# Patient Record
Sex: Female | Born: 1975
Health system: Southern US, Community
[De-identification: ages and names within clinical notes are randomized; demographics above are authoritative.]

## PROBLEM LIST (undated history)

## (undated) DIAGNOSIS — I10 Essential (primary) hypertension: Secondary | ICD-10-CM

## (undated) DIAGNOSIS — M1990 Unspecified osteoarthritis, unspecified site: Secondary | ICD-10-CM

## (undated) DIAGNOSIS — K649 Unspecified hemorrhoids: Secondary | ICD-10-CM

## (undated) DIAGNOSIS — B019 Varicella without complication: Secondary | ICD-10-CM

## (undated) DIAGNOSIS — T783XXA Angioneurotic edema, initial encounter: Secondary | ICD-10-CM

## (undated) DIAGNOSIS — L509 Urticaria, unspecified: Secondary | ICD-10-CM

## (undated) DIAGNOSIS — E785 Hyperlipidemia, unspecified: Secondary | ICD-10-CM

## (undated) HISTORY — PX: LAPAROSCOPIC GASTRIC SLEEVE RESECTION: SHX5895

## (undated) HISTORY — DX: Hyperlipidemia, unspecified: E78.5

## (undated) HISTORY — DX: Essential (primary) hypertension: I10

## (undated) HISTORY — DX: Varicella without complication: B01.9

## (undated) HISTORY — DX: Angioneurotic edema, initial encounter: T78.3XXA

## (undated) HISTORY — DX: Urticaria, unspecified: L50.9

## (undated) HISTORY — DX: Unspecified hemorrhoids: K64.9

## (undated) HISTORY — DX: Unspecified osteoarthritis, unspecified site: M19.90

---

## 2000-05-26 HISTORY — PX: KNEE SURGERY: SHX244

## 2017-09-02 LAB — BASIC METABOLIC PANEL
CO2: 27 — AB (ref 13–22)
Chloride: 106 (ref 99–108)
Creatinine: 0.6 (ref 0.5–1.1)
Glucose: 16
Potassium: 3.9 (ref 3.4–5.3)
Sodium: 137 (ref 137–147)

## 2017-09-02 LAB — HEPATIC FUNCTION PANEL
ALT: 14 (ref 7–35)
AST: 13 (ref 13–35)
Alkaline Phosphatase: 36 (ref 25–125)
Bilirubin, Total: 0.3

## 2017-09-02 LAB — COMPREHENSIVE METABOLIC PANEL
Albumin: 4.1 (ref 3.5–5.0)
Calcium: 9.4 (ref 8.7–10.7)
GFR calc non Af Amer: 59

## 2017-09-02 LAB — CBC AND DIFFERENTIAL
HCT: 40 (ref 36–46)
Hemoglobin: 13.4 (ref 12.0–16.0)
Platelets: 312 (ref 150–399)
WBC: 5.9

## 2017-09-02 LAB — LIPID PANEL
Cholesterol: 210 — AB (ref 0–200)
HDL: 45 (ref 35–70)
LDL Cholesterol: 153
Triglycerides: 62 (ref 40–160)

## 2017-09-02 LAB — TSH: TSH: 1.76 (ref 0.41–5.90)

## 2017-09-02 LAB — VITAMIN D 25 HYDROXY (VIT D DEFICIENCY, FRACTURES): Vit D, 25-Hydroxy: 29

## 2018-06-16 DIAGNOSIS — Z79899 Other long term (current) drug therapy: Secondary | ICD-10-CM | POA: Diagnosis not present

## 2018-06-16 DIAGNOSIS — I10 Essential (primary) hypertension: Secondary | ICD-10-CM | POA: Diagnosis not present

## 2018-06-16 DIAGNOSIS — R635 Abnormal weight gain: Secondary | ICD-10-CM | POA: Diagnosis not present

## 2018-10-07 DIAGNOSIS — F419 Anxiety disorder, unspecified: Secondary | ICD-10-CM | POA: Diagnosis not present

## 2018-10-07 DIAGNOSIS — R635 Abnormal weight gain: Secondary | ICD-10-CM | POA: Diagnosis not present

## 2019-01-31 DIAGNOSIS — B029 Zoster without complications: Secondary | ICD-10-CM | POA: Diagnosis not present

## 2019-02-04 DIAGNOSIS — Z20828 Contact with and (suspected) exposure to other viral communicable diseases: Secondary | ICD-10-CM | POA: Diagnosis not present

## 2019-02-12 DIAGNOSIS — Z23 Encounter for immunization: Secondary | ICD-10-CM | POA: Diagnosis not present

## 2019-03-05 DIAGNOSIS — Z20828 Contact with and (suspected) exposure to other viral communicable diseases: Secondary | ICD-10-CM | POA: Diagnosis not present

## 2019-03-18 DIAGNOSIS — M542 Cervicalgia: Secondary | ICD-10-CM | POA: Diagnosis not present

## 2019-03-18 DIAGNOSIS — M9903 Segmental and somatic dysfunction of lumbar region: Secondary | ICD-10-CM | POA: Diagnosis not present

## 2019-03-18 DIAGNOSIS — M9901 Segmental and somatic dysfunction of cervical region: Secondary | ICD-10-CM | POA: Diagnosis not present

## 2019-03-18 DIAGNOSIS — M545 Low back pain: Secondary | ICD-10-CM | POA: Diagnosis not present

## 2019-03-22 DIAGNOSIS — M9901 Segmental and somatic dysfunction of cervical region: Secondary | ICD-10-CM | POA: Diagnosis not present

## 2019-03-22 DIAGNOSIS — M9903 Segmental and somatic dysfunction of lumbar region: Secondary | ICD-10-CM | POA: Diagnosis not present

## 2019-03-22 DIAGNOSIS — M542 Cervicalgia: Secondary | ICD-10-CM | POA: Diagnosis not present

## 2019-03-22 DIAGNOSIS — M545 Low back pain: Secondary | ICD-10-CM | POA: Diagnosis not present

## 2019-03-23 DIAGNOSIS — M545 Low back pain: Secondary | ICD-10-CM | POA: Diagnosis not present

## 2019-03-23 DIAGNOSIS — M9903 Segmental and somatic dysfunction of lumbar region: Secondary | ICD-10-CM | POA: Diagnosis not present

## 2019-03-23 DIAGNOSIS — M9901 Segmental and somatic dysfunction of cervical region: Secondary | ICD-10-CM | POA: Diagnosis not present

## 2019-03-23 DIAGNOSIS — M542 Cervicalgia: Secondary | ICD-10-CM | POA: Diagnosis not present

## 2019-03-25 DIAGNOSIS — M9903 Segmental and somatic dysfunction of lumbar region: Secondary | ICD-10-CM | POA: Diagnosis not present

## 2019-03-25 DIAGNOSIS — M9901 Segmental and somatic dysfunction of cervical region: Secondary | ICD-10-CM | POA: Diagnosis not present

## 2019-03-25 DIAGNOSIS — M545 Low back pain: Secondary | ICD-10-CM | POA: Diagnosis not present

## 2019-03-25 DIAGNOSIS — M542 Cervicalgia: Secondary | ICD-10-CM | POA: Diagnosis not present

## 2019-04-22 DIAGNOSIS — Z1231 Encounter for screening mammogram for malignant neoplasm of breast: Secondary | ICD-10-CM | POA: Diagnosis not present

## 2019-06-13 DIAGNOSIS — Z20828 Contact with and (suspected) exposure to other viral communicable diseases: Secondary | ICD-10-CM | POA: Diagnosis not present

## 2019-07-26 DIAGNOSIS — Z0000 Encounter for general adult medical examination without abnormal findings: Secondary | ICD-10-CM | POA: Diagnosis not present

## 2019-07-26 LAB — HM PAP SMEAR: HM Pap smear: NEGATIVE

## 2019-08-25 ENCOUNTER — Other Ambulatory Visit: Payer: Self-pay

## 2019-08-25 ENCOUNTER — Ambulatory Visit: Payer: Self-pay | Attending: Internal Medicine

## 2019-08-25 DIAGNOSIS — Z23 Encounter for immunization: Secondary | ICD-10-CM

## 2019-08-25 NOTE — Progress Notes (Signed)
   Covid-19 Vaccination Clinic  Name:  Janet Owens    MRN: [id number] DOB: [birth year]  08/25/2019  [name] was observed post Covid-19 immunization for 15 minutes without incident. She was provided with Vaccine Information Sheet and instruction to access the V-Safe system.   [name] was instructed to call 911 with any severe reactions post vaccine: Marland Kitchen Difficulty breathing  . Swelling of face and throat  . A fast heartbeat  . A bad rash all over body  . Dizziness and weakness   Immunizations Administered    Name Date Dose VIS Date Route   Pfizer COVID-19 Vaccine 08/25/2019 10:04 AM 0.3 mL 05/06/2019 Intramuscular   Manufacturer: [employer], [employer]   Lot: [phone] [id number]   NDC: [address]-6

## 2019-09-04 NOTE — Progress Notes (Signed)
[facility] [place], [place], [place] [address] [phone]  Date:  09/05/2019   Name:  Janet Owens   DOB:  07/07/1975   MRN:  [id number]  PCP:  Darreld Mclean, MD    Chief Complaint: New Patient (Initial Visit) (hypertension)   History of Present Illness:  Janet Owens is a 44 y.o. very pleasant female patient who presents with the following:  Here today as a new patient to establish care She has a 76 yo son and 18 yo daughter- they are both in good health  She and her family recently moved to [place] to be closer to family She is married to  [place]- "[place]" Her mother is currently living with her immediate family  She has one younger brother and her oldest sibling died She works for [employer]- she is able to work from home, has done so since 2016 Her father passed last fall following a battle with biliary tract carcinoma  She has HTN- since she was about 44 yo She gained some weight while her dad was sick and passing away- she put in about 35lbs and is working on this again.  A low-carb diet seems to work for her  Never a smoker She has OA- her mother has this as well  She uses naproxen on a daily basis; she was given mobic in the past from a specialist.  She was not sure if this worked any better than naproxen, so she stopped taking it She does sometimes have GERD sx  The naproxen does not always work but does not okay job, Tylenol is not effective She is taking an OTC antiacid -H2 blocker  She did have a CPE not long ago-  Her former PCP was Dr Alberteen Spindle Address: [address], [place], [place]  Phone: [phone] We requested records from Dr. Dorann Ou  She has a copper IUD  Her husband is interested in a vasectomy -after he has this surgery she would like to have her IUD removed Patient Active Problem List   Diagnosis Date Noted  . Essential hypertension 09/05/2019  .  Hyperlipidemia 09/05/2019    No past medical history on file.    Social History   Tobacco Use  . Smoking status: Never Smoker  . Smokeless tobacco: Never Used  Substance Use Topics  . Alcohol use: Yes  . Drug use: Never    No family history on file.  Allergies  Allergen Reactions  . Other Hives  . Penicillins Hives    Medication list has been reviewed and updated.  Current Outpatient Medications on File Prior to Visit  Medication Sig Dispense Refill  . Echinacea 450 MG CAPS Take 450 mg by mouth daily.    . famotidine (PEPCID) 20 MG tablet Take 20 mg by mouth daily.    Marland Kitchen lisinopril (ZESTRIL) 20 MG tablet Take 20 mg by mouth daily.    . naproxen sodium (ALEVE) 220 MG tablet Take 220 mg by mouth.    Marland Kitchen UNABLE TO FIND 2,000 Units. Med Name: Naltokinase    . UNABLE TO FIND Med Name: Calcium with D3 40 mcg 1600 units D3 20 mg    . vitamin k 100 MCG tablet Take 100 mcg by mouth daily.     No current facility-administered medications on file prior to visit.    Review of Systems:  As per HPI- otherwise negative.   Physical Examination: Vitals:  09/05/19 1143  BP: 132/84  Pulse: 60  Resp: 16  Temp: 98.1 F (36.7 C)  SpO2: 95%   Vitals:   09/05/19 1143  Weight: 254 lb (115.2 kg)  Height: 5\' 3"  (1.6 m)   Body mass index is 44.99 kg/m. Ideal Body Weight: Weight in (lb) to have BMI = 25: 140.8  GEN: no acute distress.  Obese, otherwise looks well HEENT: Atraumatic, Normocephalic.  Ears and Nose: No external deformity. CV: RRR, No M/G/R. No JVD. No thrill. No extra heart sounds. PULM: CTA B, no wheezes, crackles, rhonchi. No retractions. No resp. distress. No accessory muscle use. ABD: S, NT, ND, +BS. No rebound. No HSM. EXTR: No c/c/e PSYCH: Normally interactive. Conversant.    Assessment and Plan: Essential hypertension  Mixed hyperlipidemia  Class 3 severe obesity without serious comorbidity with body mass index (BMI) of 40.0 to 44.9 in adult,  unspecified obesity type (HCC)  Spondylosis of cervical region without myelopathy or radiculopathy - Plan: celecoxib (CELEBREX) 200 MG capsule  Here today as a new patient to establish care.  She recently moved to this area Blood pressures under good control on current dose of lisinopril.  Reminded patient that pregnancy is contraindicated while on ACE inhibitor For the time being she does not wish to take medication for lipids She notes chronic neck pain due to arthritis.  She is currently using OTC naproxen which works fairly well, but does cause some gastritis.  We will have her try Celebrex.  I offered to do an H. pylori breath test, but she really does not have time today She plans to work on her weight and lose some of the pounds she put on while her father was sick She plans to see me for a physical when due Moderate medical decision making today This visit occurred during the SARS-CoV-2 public health emergency.  Safety protocols were in place, including screening questions prior to the visit, additional usage of staff PPE, and extensive cleaning of exam room while observing appropriate contact time as indicated for disinfecting solutions.    Signed , MD

## 2019-09-04 NOTE — Patient Instructions (Addendum)
It was very nice to meet you today- I am so sorry for the loss of your dad We will request your records from Dr Lyndal Rainbow Try celebrex once a day as needed for arthritis pain- please let me know if this helps with your stomach irritation   We can plan to visit when your next physical is due otherwise

## 2019-09-05 ENCOUNTER — Other Ambulatory Visit: Payer: Self-pay

## 2019-09-05 ENCOUNTER — Ambulatory Visit: Payer: BLUE CROSS/BLUE SHIELD | Admitting: Family Medicine

## 2019-09-05 ENCOUNTER — Encounter: Payer: Self-pay | Admitting: Family Medicine

## 2019-09-05 VITALS — BP 132/84 | HR 60 | Temp 98.1°F | Resp 16 | Ht 63.0 in | Wt 254.0 lb

## 2019-09-05 DIAGNOSIS — E782 Mixed hyperlipidemia: Secondary | ICD-10-CM | POA: Diagnosis not present

## 2019-09-05 DIAGNOSIS — I10 Essential (primary) hypertension: Secondary | ICD-10-CM | POA: Insufficient documentation

## 2019-09-05 DIAGNOSIS — M47812 Spondylosis without myelopathy or radiculopathy, cervical region: Secondary | ICD-10-CM | POA: Diagnosis not present

## 2019-09-05 DIAGNOSIS — E66811 Obesity, class 1: Secondary | ICD-10-CM | POA: Insufficient documentation

## 2019-09-05 DIAGNOSIS — E785 Hyperlipidemia, unspecified: Secondary | ICD-10-CM | POA: Insufficient documentation

## 2019-09-05 DIAGNOSIS — Z6841 Body Mass Index (BMI) 40.0 and over, adult: Secondary | ICD-10-CM

## 2019-09-05 MED ORDER — CELECOXIB 200 MG PO CAPS: 200.0000 mg | ORAL_CAPSULE | Freq: Every day | ORAL | 1 refills | Status: DC

## 2019-09-12 ENCOUNTER — Telehealth: Payer: Self-pay

## 2019-09-12 ENCOUNTER — Encounter: Payer: Self-pay | Admitting: Family Medicine

## 2019-09-12 ENCOUNTER — Telehealth: Payer: Self-pay | Admitting: Family Medicine

## 2019-09-12 NOTE — Telephone Encounter (Signed)
Caller : Day Greb  Call Back # [phone]  Subject : Pre Auth For Medication   Medication : celecoxib (CELEBREX) 200 MG capsule  Patient states that insurance will not fill until prior Berkley Harvey is granted.   Insurance Telephone # [phone]

## 2019-09-12 NOTE — Telephone Encounter (Signed)
Initiated prior authorization

## 2019-09-14 ENCOUNTER — Encounter: Payer: Self-pay | Admitting: Family Medicine

## 2019-09-14 DIAGNOSIS — M47812 Spondylosis without myelopathy or radiculopathy, cervical region: Secondary | ICD-10-CM

## 2019-09-14 MED ORDER — CELECOXIB 200 MG PO CAPS: 200.0000 mg | ORAL_CAPSULE | Freq: Every day | ORAL | 1 refills | Status: DC

## 2019-09-14 NOTE — Telephone Encounter (Signed)
Dr. Copland, the prior auth I completed for this medication is not approved per her insurance. They are requiring her to try naproxen, etodolac or ibuprofen. Please advise and/or send in new alternative medication.  

## 2019-09-14 NOTE — Telephone Encounter (Signed)
Dr. Patsy Lager, the prior auth I completed for this medication is not approved per her insurance. They are requiring her to try naproxen, etodolac or ibuprofen. Please advise and/or send in new alternative medication.

## 2019-09-20 ENCOUNTER — Encounter: Payer: Self-pay | Admitting: Family Medicine

## 2019-09-20 ENCOUNTER — Ambulatory Visit: Payer: Self-pay

## 2019-12-02 DIAGNOSIS — Z20822 Contact with and (suspected) exposure to covid-19: Secondary | ICD-10-CM | POA: Diagnosis not present

## 2019-12-12 DIAGNOSIS — B351 Tinea unguium: Secondary | ICD-10-CM | POA: Diagnosis not present

## 2020-02-25 NOTE — Progress Notes (Addendum)
[facility] [address] [place], [place] [address] [phone]  Date:  02/27/2020   Name:  Janet Owens   DOB:  April 18, 1976   MRN:  [id number]  PCP:  Pearline Cables, MD    Chief Complaint: Arm Pain (left arm pain, radiates to neck, going on several years, arthritis related)   History of Present Illness:  Janet Owens is a 44 y.o. very pleasant female patient who presents with the following:  Patient with history of hypertension, hyperlipidemia, today, spondylosis of cervical region  Here today with concern of a few things She has been using celebrex for her pain which is better for her stomach than ibuprofen; she no longer has to take antacids  She repeat seeing orthopedics a few years ago, told she had OA in her cervical spine- she thinks this is what is causing her left arm and shoulder pain.  They did do some cervical spine films at that time The pain may shoot down the arm.  The right arm is ok  She has had trouble with her neck and shoulder for about 3 years  She also recently noted one episode of chest pain One night she noted a sharp feeling of pain in her chest that lasted for 20 minutes- this occurred perhaps 3 weeks ago.  It was to the left of her sternum Has not occurred again She does not get any CP or SOB with exercise She goes up and down stairs a lot and feels fine No personal history of CAD Mother has HTN and hyperlipidemia Pt has not smoked in about 15 years - former light smoker only   Last seen by myself in April of this year to establish care as a new patient Married to [place], has 2 young children and works for [employer] She is under a lot of stress right now- she is splitting from her husband, she got a promotion and started a new job Her children are 7 and 79 yo - they are in good health   COVID-19 series- done  Flu vaccine- pt plans to do later on this season as she  just started a new job  Can do HIV and hep C screening with next routine labs Blood drawn in April  She has an IUD in place-no risk of pregnancy  BP Readings from Last 3 Encounters:  02/27/20 136/82  09/05/19 132/84     Wt Readings from Last 3 Encounters:  02/27/20 238 lb (108 kg)  09/05/19 254 lb (115.2 kg)    Patient Active Problem List   Diagnosis Date Noted  . Essential hypertension 09/05/2019  . Hyperlipidemia 09/05/2019  . Class 3 severe obesity without serious comorbidity with body mass index (BMI) of 40.0 to 44.9 in adult [facility]) 09/05/2019  . Spondylosis of cervical region without myelopathy or radiculopathy 09/05/2019    Past Medical History:  Diagnosis Date  . Arthritis   . Chickenpox   . Hyperlipidemia   . Hypertension     No past surgical history on file.  Social History   Tobacco Use  . Smoking status: Never Smoker  . Smokeless tobacco: Never Used  Substance Use Topics  . Alcohol use: Yes  . Drug use: Never    Family History  Problem Relation Age of Onset  . Arthritis Mother   . Hyperlipidemia Mother   . Hypertension Mother   . Cancer Father   . COPD Father   .  Depression Father   . Drug abuse Father   . Early death Father   . Heart disease Father   . Hearing loss Father   . Stroke Father   . Alcohol abuse Father   . Alcohol abuse Maternal Grandmother   . Hearing loss Maternal Grandmother   . Miscarriages / Stillbirths Maternal Grandmother   . Heart attack Maternal Grandfather   . Hyperlipidemia Maternal Grandfather   . Hypertension Maternal Grandfather   . Alcohol abuse Paternal Grandmother   . Depression Paternal Grandmother   . Drug abuse Paternal Grandmother   . Heart attack Paternal Grandmother   . Hyperlipidemia Paternal Grandmother   . Hypertension Paternal Grandmother   . Heart disease Paternal Grandmother   . Birth defects Paternal Grandfather   . Alcohol abuse Brother   . Asthma Brother   . Drug abuse Brother   . Early  death Brother     Allergies  Allergen Reactions  . Other Hives  . Penicillins Hives  . Latex Rash    Medication list has been reviewed and updated.  Current Outpatient Medications on File Prior to Visit  Medication Sig Dispense Refill  . celecoxib (CELEBREX) 200 MG capsule Take 1 capsule (200 mg total) by mouth daily. Use as needed for arthritis 90 capsule 1  . Echinacea 450 MG CAPS Take 450 mg by mouth daily.    Marland Kitchen. lisinopril (ZESTRIL) 20 MG tablet Take 20 mg by mouth daily.    . naproxen sodium (ALEVE) 220 MG tablet Take 220 mg by mouth.    Marland Kitchen. UNABLE TO FIND 2,000 Units. Med Name: Naltokinase    . UNABLE TO FIND Med Name: Calcium with D3 40 mcg 1600 units D3 20 mg    . vitamin k 100 MCG tablet Take 100 mcg by mouth daily.     No current facility-administered medications on file prior to visit.    Review of Systems:  As per HPI- otherwise negative.   Physical Examination: Vitals:   02/27/20 1444  BP: 136/82  Pulse: 76  Resp: 17  SpO2: 97%   Vitals:   02/27/20 1444  Weight: 238 lb (108 kg)  Height: 5\' 3"  (1.6 m)   Body mass index is 42.16 kg/m. Ideal Body Weight: Weight in (lb) to have BMI = 25: 140.8  GEN: no acute distress.  Obese, looks well  HEENT: Atraumatic, Normocephalic.  Ears and Nose: No external deformity. CV: RRR, No M/G/R. No JVD. No thrill. No extra heart sounds. PULM: CTA B, no wheezes, crackles, rhonchi. No retractions. No resp. distress. No accessory muscle use. ABD: S, NT, ND, +BS. No rebound. No HSM. EXTR: No c/c/e PSYCH: Normally interactive. Conversant.  Left shoulder; she has some discomfort with external rotation, internal rotation is mildly limited and uncomfortable.  Normal strength of upper extremity muscles  EKG:  SR, wnl No old EKG tracings for comparison  Assessment and Plan: Chest pain, unspecified type - Plan: EKG 12-Lead, DG Chest 2 View  Chronic left shoulder pain - Plan: Ambulatory referral to Orthopedic Surgery  Mixed  hyperlipidemia - Plan: Lipid panel  Essential hypertension - Plan: Comprehensive metabolic panel, CBC, CANCELED: CBC  Screening for thyroid disorder - Plan: TSH  Encounter for vitamin deficiency screening - Plan: VITAMIN D 25 Hydroxy (Vit-D Deficiency, Fractures)  Encounter for hepatitis C screening test for low risk patient - Plan: Hepatitis C antibody  Screening for HIV (human immunodeficiency virus) - Plan: HIV Antibody (routine testing w rflx)  Screening for diabetes  mellitus - Plan: Hemoglobin A1c  Spondylosis of cervical region without myelopathy or radiculopathy - Plan: celecoxib (CELEBREX) 200 MG capsule  Following up today for a couple of concerns Routine labs pending as above Refilled Celebrex which he uses as needed for neck and shoulder pain Patient has noticed pain in her left shoulder and neck for several years, has been seen orthopedics in [facility] previously.  She is already doing some physical therapy exercises that she learned from a phone app.  She may benefit from injection.  Will refer to orthopedics for further evaluation  She also noted one episode of chest pain about 3 weeks ago, occurred at night/at rest.  EKG today is normal Will obtain chest film Suspect chest pain may be related to a noncardiac issue such as stress, but certainly we are glad to order a exercise treadmill for the patient.  Await chest film, she will let me know how she would like to proceed.  She is advised to alert me if symptoms do recur Will plan further follow- up pending labs.  This visit occurred during the SARS-CoV-2 public health emergency.  Safety protocols were in place, including screening questions prior to the visit, additional usage of staff PPE, and extensive cleaning of exam room while observing appropriate contact time as indicated for disinfecting solutions.    Signed Abbe Amsterdam, MD  Received her labs 10/5- message to pt  Results for orders placed or  performed in visit on 02/27/20  Comprehensive metabolic panel  Result Value Ref Range   Glucose, Bld 86 65 - 99 mg/dL   BUN 11 7 - 25 mg/dL   Creat 6.75 9.16 - 3.84 mg/dL   BUN/Creatinine Ratio NOT APPLICABLE 6 - 22 (calc)   Sodium 137 135 - 146 mmol/L   Potassium 3.9 3.5 - 5.3 mmol/L   Chloride 103 98 - 110 mmol/L   CO2 25 20 - 32 mmol/L   Calcium 9.8 8.6 - 10.2 mg/dL   Total Protein 7.2 6.1 - 8.1 g/dL   Albumin 4.5 3.6 - 5.1 g/dL   Globulin 2.7 1.9 - 3.7 g/dL (calc)   AG Ratio 1.7 1.0 - 2.5 (calc)   Total Bilirubin 0.2 0.2 - 1.2 mg/dL   Alkaline phosphatase (APISO) 39 31 - 125 U/L   AST 9 (L) 10 - 30 U/L   ALT 8 6 - 29 U/L  Hemoglobin A1c  Result Value Ref Range   Hgb A1c MFr Bld 5.3 <5.7 % of total Hgb   Mean Plasma Glucose 105 (calc)   eAG (mmol/L) 5.8 (calc)  Hepatitis C antibody  Result Value Ref Range   Hepatitis C Ab NON-REACTIVE NON-REACTI   SIGNAL TO CUT-OFF 0.07 <1.00  Lipid panel  Result Value Ref Range   Cholesterol 250 (H) <200 mg/dL   HDL 46 (L) > OR = 50 mg/dL   Triglycerides 70 <665 mg/dL   LDL Cholesterol (Calc) 187 (H) mg/dL (calc)   Total CHOL/HDL Ratio 5.4 (H) <5.0 (calc)   Non-HDL Cholesterol (Calc) 204 (H) <130 mg/dL (calc)  HIV Antibody (routine testing w rflx)  Result Value Ref Range   HIV 1&2 Ab, 4th Generation NON-REACTIVE NON-REACTI  TSH  Result Value Ref Range   TSH 1.46 mIU/L  VITAMIN D 25 Hydroxy (Vit-D Deficiency, Fractures)  Result Value Ref Range   Vit D, 25-Hydroxy 20 (L) 30 - 100 ng/mL  CBC  Result Value Ref Range   WBC 6.4 3.8 - 10.8 Thousand/uL   RBC  4.50 3.80 - 5.10 Million/uL   Hemoglobin 13.5 11.7 - 15.5 g/dL   HCT 60.1 35 - 45 %   MCV 90.9 80.0 - 100.0 fL   MCH 30.0 27.0 - 33.0 pg   MCHC 33.0 32.0 - 36.0 g/dL   RDW 09.3 23.5 - 57.3 %   Platelets 318 140 - 400 Thousand/uL   MPV 9.5 7.5 - 12.5 fL

## 2020-02-27 ENCOUNTER — Ambulatory Visit (INDEPENDENT_AMBULATORY_CARE_PROVIDER_SITE_OTHER): Payer: BLUE CROSS/BLUE SHIELD | Admitting: Family Medicine

## 2020-02-27 ENCOUNTER — Other Ambulatory Visit: Payer: Self-pay

## 2020-02-27 ENCOUNTER — Encounter: Payer: Self-pay | Admitting: Family Medicine

## 2020-02-27 VITALS — BP 136/82 | HR 76 | Resp 17 | Ht 63.0 in | Wt 238.0 lb

## 2020-02-27 DIAGNOSIS — M25512 Pain in left shoulder: Secondary | ICD-10-CM | POA: Diagnosis not present

## 2020-02-27 DIAGNOSIS — Z1321 Encounter for screening for nutritional disorder: Secondary | ICD-10-CM

## 2020-02-27 DIAGNOSIS — Z131 Encounter for screening for diabetes mellitus: Secondary | ICD-10-CM | POA: Diagnosis not present

## 2020-02-27 DIAGNOSIS — G8929 Other chronic pain: Secondary | ICD-10-CM

## 2020-02-27 DIAGNOSIS — Z1159 Encounter for screening for other viral diseases: Secondary | ICD-10-CM

## 2020-02-27 DIAGNOSIS — R079 Chest pain, unspecified: Secondary | ICD-10-CM | POA: Diagnosis not present

## 2020-02-27 DIAGNOSIS — E782 Mixed hyperlipidemia: Secondary | ICD-10-CM | POA: Diagnosis not present

## 2020-02-27 DIAGNOSIS — R5383 Other fatigue: Secondary | ICD-10-CM | POA: Diagnosis not present

## 2020-02-27 DIAGNOSIS — Z114 Encounter for screening for human immunodeficiency virus [HIV]: Secondary | ICD-10-CM

## 2020-02-27 DIAGNOSIS — Z1329 Encounter for screening for other suspected endocrine disorder: Secondary | ICD-10-CM

## 2020-02-27 DIAGNOSIS — I10 Essential (primary) hypertension: Secondary | ICD-10-CM | POA: Diagnosis not present

## 2020-02-27 DIAGNOSIS — M47812 Spondylosis without myelopathy or radiculopathy, cervical region: Secondary | ICD-10-CM

## 2020-02-27 DIAGNOSIS — E559 Vitamin D deficiency, unspecified: Secondary | ICD-10-CM

## 2020-02-27 MED ORDER — LISINOPRIL 20 MG PO TABS
20.0000 mg | ORAL_TABLET | Freq: Every day | ORAL | 3 refills | Status: DC
Start: 2020-02-27 — End: 2021-01-16

## 2020-02-27 MED ORDER — CELECOXIB 200 MG PO CAPS: 200.0000 mg | ORAL_CAPSULE | Freq: Every day | ORAL | 1 refills | Status: DC

## 2020-02-27 NOTE — Patient Instructions (Addendum)
It was good to see you again today- please have your labs drawn today and I will be in touch with results asap.  Please go and get a chest film at your convenience at [address] [facility]  We can think about doing a stress test for your heart at some point if you like.   I do think you have some rotator cuff tendonitis in your shoulder; it may be helpful for you to get a steroid injection in your shoulder I will set you up to see ortho for this concern

## 2020-02-28 ENCOUNTER — Encounter: Payer: Self-pay | Admitting: Family Medicine

## 2020-02-28 DIAGNOSIS — E785 Hyperlipidemia, unspecified: Secondary | ICD-10-CM

## 2020-02-28 LAB — CBC
HCT: 40.9 % (ref 35.0–45.0)
Hemoglobin: 13.5 g/dL (ref 11.7–15.5)
MCH: 30 pg (ref 27.0–33.0)
MCHC: 33 g/dL (ref 32.0–36.0)
MCV: 90.9 fL (ref 80.0–100.0)
MPV: 9.5 fL (ref 7.5–12.5)
Platelets: 318 10*3/uL (ref 140–400)
RBC: 4.5 10*6/uL (ref 3.80–5.10)
RDW: 12.1 % (ref 11.0–15.0)
WBC: 6.4 10*3/uL (ref 3.8–10.8)

## 2020-02-28 LAB — LIPID PANEL
Cholesterol: 250 mg/dL — ABNORMAL HIGH (ref <200)
HDL: 46 mg/dL — ABNORMAL LOW (ref >=50)
LDL Cholesterol (Calc): 187 mg/dL (calc) — ABNORMAL HIGH
Non-HDL Cholesterol (Calc): 204 mg/dL (calc) — ABNORMAL HIGH (ref <130)
Total CHOL/HDL Ratio: 5.4 (calc) — ABNORMAL HIGH (ref <5.0)
Triglycerides: 70 mg/dL (ref <150)

## 2020-02-28 LAB — COMPREHENSIVE METABOLIC PANEL
AG Ratio: 1.7 (calc) (ref 1.0–2.5)
ALT: 8 U/L (ref 6–29)
AST: 9 U/L — ABNORMAL LOW (ref 10–30)
Albumin: 4.5 g/dL (ref 3.6–5.1)
Alkaline phosphatase (APISO): 39 U/L (ref 31–125)
BUN: 11 mg/dL (ref 7–25)
CO2: 25 mmol/L (ref 20–32)
Calcium: 9.8 mg/dL (ref 8.6–10.2)
Chloride: 103 mmol/L (ref 98–110)
Creat: 0.67 mg/dL (ref 0.50–1.10)
Globulin: 2.7 g/dL (calc) (ref 1.9–3.7)
Glucose, Bld: 86 mg/dL (ref 65–99)
Potassium: 3.9 mmol/L (ref 3.5–5.3)
Sodium: 137 mmol/L (ref 135–146)
Total Bilirubin: 0.2 mg/dL (ref 0.2–1.2)
Total Protein: 7.2 g/dL (ref 6.1–8.1)

## 2020-02-28 LAB — HEPATITIS C ANTIBODY
Hepatitis C Ab: NONREACTIVE
SIGNAL TO CUT-OFF: 0.07 (ref <1.00)

## 2020-02-28 LAB — HEMOGLOBIN A1C
Hgb A1c MFr Bld: 5.3 % of total Hgb (ref <5.7)
Mean Plasma Glucose: 105 (calc)
eAG (mmol/L): 5.8 (calc)

## 2020-02-28 LAB — VITAMIN D 25 HYDROXY (VIT D DEFICIENCY, FRACTURES): Vit D, 25-Hydroxy: 20 ng/mL — ABNORMAL LOW (ref 30–100)

## 2020-02-28 LAB — HIV ANTIBODY (ROUTINE TESTING W REFLEX): HIV 1&2 Ab, 4th Generation: NONREACTIVE

## 2020-02-28 LAB — TSH: TSH: 1.46 mIU/L

## 2020-02-28 MED ORDER — VITAMIN D3 1.25 MG (50000 UT) PO CAPS: ORAL_CAPSULE | ORAL | 0 refills | Status: DC

## 2020-02-28 MED ORDER — ROSUVASTATIN CALCIUM 10 MG PO TABS: 10.0000 mg | ORAL_TABLET | Freq: Every day | ORAL | 3 refills | Status: DC

## 2020-02-28 NOTE — Addendum Note (Signed)
Addended by: Abbe Amsterdam C on: 02/28/2020 01:19 PM   Modules accepted: Orders

## 2020-04-03 ENCOUNTER — Other Ambulatory Visit: Payer: Self-pay

## 2020-04-03 ENCOUNTER — Ambulatory Visit: Payer: BLUE CROSS/BLUE SHIELD | Admitting: Orthopaedic Surgery

## 2020-04-03 ENCOUNTER — Ambulatory Visit: Payer: Self-pay

## 2020-04-03 ENCOUNTER — Encounter: Payer: Self-pay | Admitting: Orthopaedic Surgery

## 2020-04-03 DIAGNOSIS — M5412 Radiculopathy, cervical region: Secondary | ICD-10-CM | POA: Diagnosis not present

## 2020-04-03 DIAGNOSIS — M7532 Calcific tendinitis of left shoulder: Secondary | ICD-10-CM | POA: Diagnosis not present

## 2020-04-03 MED ORDER — METHOCARBAMOL 500 MG PO TABS: 500.0000 mg | ORAL_TABLET | Freq: Two times a day (BID) | ORAL | 0 refills | Status: DC | PRN

## 2020-04-03 MED ORDER — PREDNISONE 10 MG (21) PO TBPK: ORAL_TABLET | ORAL | 0 refills | Status: DC

## 2020-04-03 NOTE — Progress Notes (Signed)
Office Visit Note   Patient: Janet Owens           Date of Birth: 12/25/1975           MRN: [id number] Visit Date: 04/03/2020              Requested by: Pearline Cables, MD [address] [place],  [place] [address] PCP: Pearline Cables, MD   Assessment & Plan: Visit Diagnoses:  1. Calcific tendinitis of left shoulder   2. Radiculopathy of cervical spine     Plan: Impression is left shoulder calcific rotator cuff tendinitis and left upper extremity cervical spine radiculopathy.  We have discussed subacromial cortisone injection to the left shoulder as well as steroid Dosepak and muscle relaxers to take as needed.  We have also discussed physical therapy but she would like to hold off for now as she has a lot going on.  She will call us if she changes her mind.  She will follow up with [place] as needed.  Follow-Up Instructions: Return if symptoms worsen or fail to improve.   Orders:  Orders Placed This Encounter  Procedures  . XR Shoulder Left  . XR Cervical Spine 2 or 3 views   Meds ordered this encounter  Medications  . predniSONE (STERAPRED UNI-PAK 21 TAB) 10 MG (21) TBPK tablet    Sig: Take as directed    Dispense:  21 tablet    Refill:  0  . methocarbamol (ROBAXIN) 500 MG tablet    Sig: Take 1 tablet (500 mg total) by mouth 2 (two) times daily as needed.    Dispense:  20 tablet    Refill:  0      Procedures: No procedures performed   Clinical Data: No additional findings.   Subjective: Chief Complaint  Patient presents with  . Neck - Pain  . Left Shoulder - Pain    HPI patient is a very pleasant 44 year old female who comes in today with left-sided neck pain and left arm pain and paresthesias.  This began approximately 3 years ago.  The pain she has is described as a constant ache with stabbing sensations at times.  Stress as well as lifting her left arm seems to aggravate her symptoms.  She has recently soft which did seem to help her  shoulder pain.  She has been taking naproxen, Celebrex and ibuprofen all at different times which do seem to somewhat help.  She notes numbness and tingling to the entire left arm which is intermittent in nature.  No previous cortisone injection to the left shoulder.  Review of Systems as detailed in HPI.  All others reviewed and are negative.   Objective: Vital Signs: There were no vitals taken for this visit.  Physical Exam well-developed well-nourished female no acute distress.  Alert oriented x3.  Ortho Exam left shoulder exam shows full range of motion all planes with the exception of internal rotation for which she gets L5.  Minimally positive empty can.  Negative cross body adduction.  No tenderness to the [facility] joint.  Full range of motion of the neck without pain.  She does have tenderness to the left parascapular region.  She is neurovascular intact distally.  Specialty Comments:  No specialty comments available.  Imaging: XR Cervical Spine 2 or 3 views  Result Date: 04/03/2020 X-rays demonstrate moderate degenerative changes C5-6 C6-7 with straightening of the cervical spine  XR Shoulder Left  Result Date: 04/03/2020 X-rays demonstrate  calcifications along the supraspinatus    PMFS History: Patient Active Problem List   Diagnosis Date Noted  . Essential hypertension 09/05/2019  . Hyperlipidemia 09/05/2019  . Class 3 severe obesity without serious comorbidity with body mass index (BMI) of 40.0 to 44.9 in adult [facility]) 09/05/2019  . Spondylosis of cervical region without myelopathy or radiculopathy 09/05/2019   Past Medical History:  Diagnosis Date  . Arthritis   . Chickenpox   . Hyperlipidemia   . Hypertension     Family History  Problem Relation Age of Onset  . Arthritis Mother   . Hyperlipidemia Mother   . Hypertension Mother   . Cancer Father   . COPD Father   . Depression Father   . Drug abuse Father   . Early death Father   . Heart disease Father   .  Hearing loss Father   . Stroke Father   . Alcohol abuse Father   . Alcohol abuse Maternal Grandmother   . Hearing loss Maternal Grandmother   . Miscarriages / Stillbirths Maternal Grandmother   . Heart attack Maternal Grandfather   . Hyperlipidemia Maternal Grandfather   . Hypertension Maternal Grandfather   . Alcohol abuse Paternal Grandmother   . Depression Paternal Grandmother   . Drug abuse Paternal Grandmother   . Heart attack Paternal Grandmother   . Hyperlipidemia Paternal Grandmother   . Hypertension Paternal Grandmother   . Heart disease Paternal Grandmother   . Birth defects Paternal Grandfather   . Alcohol abuse Brother   . Asthma Brother   . Drug abuse Brother   . Early death Brother     History reviewed. No pertinent surgical history. Social History   Occupational History  . Not on file  Tobacco Use  . Smoking status: Never Smoker  . Smokeless tobacco: Never Used  Substance and Sexual Activity  . Alcohol use: Yes  . Drug use: Never  . Sexual activity: Not on file

## 2020-04-11 DIAGNOSIS — Z1239 Encounter for other screening for malignant neoplasm of breast: Secondary | ICD-10-CM | POA: Diagnosis not present

## 2020-04-11 DIAGNOSIS — Z1231 Encounter for screening mammogram for malignant neoplasm of breast: Secondary | ICD-10-CM | POA: Diagnosis not present

## 2020-04-11 LAB — HM MAMMOGRAPHY

## 2020-04-18 ENCOUNTER — Encounter: Payer: Self-pay | Admitting: Family Medicine

## 2020-07-04 DIAGNOSIS — M9901 Segmental and somatic dysfunction of cervical region: Secondary | ICD-10-CM | POA: Diagnosis not present

## 2020-07-04 DIAGNOSIS — M5412 Radiculopathy, cervical region: Secondary | ICD-10-CM | POA: Diagnosis not present

## 2020-07-04 DIAGNOSIS — M9902 Segmental and somatic dysfunction of thoracic region: Secondary | ICD-10-CM | POA: Diagnosis not present

## 2020-07-06 DIAGNOSIS — M5412 Radiculopathy, cervical region: Secondary | ICD-10-CM | POA: Diagnosis not present

## 2020-07-06 DIAGNOSIS — M9902 Segmental and somatic dysfunction of thoracic region: Secondary | ICD-10-CM | POA: Diagnosis not present

## 2020-07-06 DIAGNOSIS — M9901 Segmental and somatic dysfunction of cervical region: Secondary | ICD-10-CM | POA: Diagnosis not present

## 2020-07-09 DIAGNOSIS — M9902 Segmental and somatic dysfunction of thoracic region: Secondary | ICD-10-CM | POA: Diagnosis not present

## 2020-07-09 DIAGNOSIS — M9901 Segmental and somatic dysfunction of cervical region: Secondary | ICD-10-CM | POA: Diagnosis not present

## 2020-07-09 DIAGNOSIS — M5412 Radiculopathy, cervical region: Secondary | ICD-10-CM | POA: Diagnosis not present

## 2020-07-18 DIAGNOSIS — M9901 Segmental and somatic dysfunction of cervical region: Secondary | ICD-10-CM | POA: Diagnosis not present

## 2020-07-18 DIAGNOSIS — M9902 Segmental and somatic dysfunction of thoracic region: Secondary | ICD-10-CM | POA: Diagnosis not present

## 2020-07-18 DIAGNOSIS — M5412 Radiculopathy, cervical region: Secondary | ICD-10-CM | POA: Diagnosis not present

## 2020-07-25 DIAGNOSIS — M5412 Radiculopathy, cervical region: Secondary | ICD-10-CM | POA: Diagnosis not present

## 2020-07-25 DIAGNOSIS — M9902 Segmental and somatic dysfunction of thoracic region: Secondary | ICD-10-CM | POA: Diagnosis not present

## 2020-07-25 DIAGNOSIS — M9901 Segmental and somatic dysfunction of cervical region: Secondary | ICD-10-CM | POA: Diagnosis not present

## 2020-08-01 DIAGNOSIS — M9901 Segmental and somatic dysfunction of cervical region: Secondary | ICD-10-CM | POA: Diagnosis not present

## 2020-08-01 DIAGNOSIS — M5412 Radiculopathy, cervical region: Secondary | ICD-10-CM | POA: Diagnosis not present

## 2020-08-01 DIAGNOSIS — M9902 Segmental and somatic dysfunction of thoracic region: Secondary | ICD-10-CM | POA: Diagnosis not present

## 2020-08-08 DIAGNOSIS — M9901 Segmental and somatic dysfunction of cervical region: Secondary | ICD-10-CM | POA: Diagnosis not present

## 2020-08-08 DIAGNOSIS — M9902 Segmental and somatic dysfunction of thoracic region: Secondary | ICD-10-CM | POA: Diagnosis not present

## 2020-08-08 DIAGNOSIS — M5412 Radiculopathy, cervical region: Secondary | ICD-10-CM | POA: Diagnosis not present

## 2020-08-15 DIAGNOSIS — M9902 Segmental and somatic dysfunction of thoracic region: Secondary | ICD-10-CM | POA: Diagnosis not present

## 2020-08-15 DIAGNOSIS — M5412 Radiculopathy, cervical region: Secondary | ICD-10-CM | POA: Diagnosis not present

## 2020-08-15 DIAGNOSIS — M9901 Segmental and somatic dysfunction of cervical region: Secondary | ICD-10-CM | POA: Diagnosis not present

## 2020-08-22 DIAGNOSIS — M5412 Radiculopathy, cervical region: Secondary | ICD-10-CM | POA: Diagnosis not present

## 2020-08-22 DIAGNOSIS — M9902 Segmental and somatic dysfunction of thoracic region: Secondary | ICD-10-CM | POA: Diagnosis not present

## 2020-08-22 DIAGNOSIS — M9901 Segmental and somatic dysfunction of cervical region: Secondary | ICD-10-CM | POA: Diagnosis not present

## 2020-08-29 DIAGNOSIS — M9901 Segmental and somatic dysfunction of cervical region: Secondary | ICD-10-CM | POA: Diagnosis not present

## 2020-08-29 DIAGNOSIS — M542 Cervicalgia: Secondary | ICD-10-CM | POA: Diagnosis not present

## 2020-08-29 DIAGNOSIS — M9902 Segmental and somatic dysfunction of thoracic region: Secondary | ICD-10-CM | POA: Diagnosis not present

## 2020-08-29 DIAGNOSIS — M9903 Segmental and somatic dysfunction of lumbar region: Secondary | ICD-10-CM | POA: Diagnosis not present

## 2020-09-19 DIAGNOSIS — M9901 Segmental and somatic dysfunction of cervical region: Secondary | ICD-10-CM | POA: Diagnosis not present

## 2020-09-19 DIAGNOSIS — M542 Cervicalgia: Secondary | ICD-10-CM | POA: Diagnosis not present

## 2020-09-19 DIAGNOSIS — M9902 Segmental and somatic dysfunction of thoracic region: Secondary | ICD-10-CM | POA: Diagnosis not present

## 2020-09-19 DIAGNOSIS — M9903 Segmental and somatic dysfunction of lumbar region: Secondary | ICD-10-CM | POA: Diagnosis not present

## 2020-09-26 DIAGNOSIS — L7211 Pilar cyst: Secondary | ICD-10-CM | POA: Diagnosis not present

## 2020-09-26 DIAGNOSIS — L738 Other specified follicular disorders: Secondary | ICD-10-CM | POA: Diagnosis not present

## 2020-09-26 DIAGNOSIS — D229 Melanocytic nevi, unspecified: Secondary | ICD-10-CM | POA: Diagnosis not present

## 2020-10-05 DIAGNOSIS — M9903 Segmental and somatic dysfunction of lumbar region: Secondary | ICD-10-CM | POA: Diagnosis not present

## 2020-10-05 DIAGNOSIS — M542 Cervicalgia: Secondary | ICD-10-CM | POA: Diagnosis not present

## 2020-10-05 DIAGNOSIS — M9901 Segmental and somatic dysfunction of cervical region: Secondary | ICD-10-CM | POA: Diagnosis not present

## 2020-10-05 DIAGNOSIS — M9902 Segmental and somatic dysfunction of thoracic region: Secondary | ICD-10-CM | POA: Diagnosis not present

## 2020-10-17 DIAGNOSIS — M542 Cervicalgia: Secondary | ICD-10-CM | POA: Diagnosis not present

## 2020-10-17 DIAGNOSIS — M9901 Segmental and somatic dysfunction of cervical region: Secondary | ICD-10-CM | POA: Diagnosis not present

## 2020-10-17 DIAGNOSIS — M9902 Segmental and somatic dysfunction of thoracic region: Secondary | ICD-10-CM | POA: Diagnosis not present

## 2020-10-17 DIAGNOSIS — M9903 Segmental and somatic dysfunction of lumbar region: Secondary | ICD-10-CM | POA: Diagnosis not present

## 2020-11-07 DIAGNOSIS — M542 Cervicalgia: Secondary | ICD-10-CM | POA: Diagnosis not present

## 2020-11-07 DIAGNOSIS — M9902 Segmental and somatic dysfunction of thoracic region: Secondary | ICD-10-CM | POA: Diagnosis not present

## 2020-11-07 DIAGNOSIS — M9903 Segmental and somatic dysfunction of lumbar region: Secondary | ICD-10-CM | POA: Diagnosis not present

## 2020-11-07 DIAGNOSIS — M9901 Segmental and somatic dysfunction of cervical region: Secondary | ICD-10-CM | POA: Diagnosis not present

## 2020-11-21 DIAGNOSIS — M9903 Segmental and somatic dysfunction of lumbar region: Secondary | ICD-10-CM | POA: Diagnosis not present

## 2020-11-21 DIAGNOSIS — M9902 Segmental and somatic dysfunction of thoracic region: Secondary | ICD-10-CM | POA: Diagnosis not present

## 2020-11-21 DIAGNOSIS — M9901 Segmental and somatic dysfunction of cervical region: Secondary | ICD-10-CM | POA: Diagnosis not present

## 2020-11-21 DIAGNOSIS — M542 Cervicalgia: Secondary | ICD-10-CM | POA: Diagnosis not present

## 2020-12-05 DIAGNOSIS — M546 Pain in thoracic spine: Secondary | ICD-10-CM | POA: Diagnosis not present

## 2020-12-05 DIAGNOSIS — M9901 Segmental and somatic dysfunction of cervical region: Secondary | ICD-10-CM | POA: Diagnosis not present

## 2020-12-05 DIAGNOSIS — M9902 Segmental and somatic dysfunction of thoracic region: Secondary | ICD-10-CM | POA: Diagnosis not present

## 2020-12-05 DIAGNOSIS — M9903 Segmental and somatic dysfunction of lumbar region: Secondary | ICD-10-CM | POA: Diagnosis not present

## 2020-12-05 DIAGNOSIS — M542 Cervicalgia: Secondary | ICD-10-CM | POA: Diagnosis not present

## 2021-01-02 DIAGNOSIS — M542 Cervicalgia: Secondary | ICD-10-CM | POA: Diagnosis not present

## 2021-01-02 DIAGNOSIS — M9903 Segmental and somatic dysfunction of lumbar region: Secondary | ICD-10-CM | POA: Diagnosis not present

## 2021-01-02 DIAGNOSIS — M9901 Segmental and somatic dysfunction of cervical region: Secondary | ICD-10-CM | POA: Diagnosis not present

## 2021-01-02 DIAGNOSIS — M9902 Segmental and somatic dysfunction of thoracic region: Secondary | ICD-10-CM | POA: Diagnosis not present

## 2021-01-07 ENCOUNTER — Encounter: Payer: BLUE CROSS/BLUE SHIELD | Admitting: Family Medicine

## 2021-01-10 ENCOUNTER — Telehealth: Payer: Self-pay | Admitting: Orthopaedic Surgery

## 2021-01-10 NOTE — Telephone Encounter (Signed)
Called patient left message to return call to schedule an appointment per mychart message with Dr Roda Shutters

## 2021-01-12 DIAGNOSIS — E559 Vitamin D deficiency, unspecified: Secondary | ICD-10-CM | POA: Insufficient documentation

## 2021-01-12 NOTE — Patient Instructions (Addendum)
It was great to see you again today I will be in touch with your labs We will have you start on Saxenda at 0.6 mg daily- increase by 0.6 mg weekly to goal of 3 mg  0.6 mg daily for one week Then 1.2 mg daily for one week Then 1.8 mg Then 2.4 mg Then goal dose of 3 mg daily   Take care, let me know if you need anything  Please see me in about 4 months to check on your progress with saxenda- we can check your cholesterol then as well

## 2021-01-12 NOTE — Progress Notes (Addendum)
[facility] [address], [address] [place], [place] [address] [phone]  Date:  01/16/2021   Name:  Janet Owens   DOB:  12/28/1975   MRN:  [id number]  PCP:  Pearline Cables, MD    Chief Complaint: Annual Exam and Weight Loss (Would like rx for wegovy, previously tried three weight loss drugs)   History of Present Illness:  Janet Owens is a 45 y.o. very pleasant female patient who presents with the following:  Patient seen today for physical exam History of hypertension, hyperlipidemia, obesity, spondylosis of cervical region, vitamin D deficiency  Most recent visit with myself was in [place] our discussion at that time: Married to Bovina, has 2 young children and works for [employer] She is under a lot of stress right now- she is splitting from her husband, she got a promotion and started a new job She is continuing with the separation/divorce process from her husband.  Her job is a good thing right now- it is busy  Her children are 23 and 34 yo - they are in good health They recently got 2 cats which they are really enjoying  She is still pretty stressed- 7-8/10 Her dentist noticed that she is grinding her teeth  At this time she is not interested in medication for depression or anxiety.  However, she is concerned about her weight and would like to try medication for weight loss She is interested in Saxenda/ wegovy for weight loss Reginal Lutes  is currently on national back order She does not have any contraindication to GL1 drugs -discussed all with patient  COVID-19 booster Pap is up-to-date Mammogram up-to-date Labs done in October  Lab Results  Component Value Date   HGBA1C 5.3 02/27/2020    Patient Active Problem List   Diagnosis Date Noted   Vitamin D deficiency 01/12/2021   Essential hypertension 09/05/2019   Hyperlipidemia 09/05/2019   Class 3 severe obesity without serious comorbidity  with body mass index (BMI) of 40.0 to 44.9 in adult [facility]) 09/05/2019   Spondylosis of cervical region without myelopathy or radiculopathy 09/05/2019    Past Medical History:  Diagnosis Date   Arthritis    Chickenpox    Hyperlipidemia    Hypertension     No past surgical history on file.  Social History   Tobacco Use   Smoking status: Never   Smokeless tobacco: Never  Substance Use Topics   Alcohol use: Yes   Drug use: Never    Family History  Problem Relation Age of Onset   Arthritis Mother    Hyperlipidemia Mother    Hypertension Mother    Cancer Father    COPD Father    Depression Father    Drug abuse Father    Early death Father    Heart disease Father    Hearing loss Father    Stroke Father    Alcohol abuse Father    Alcohol abuse Maternal Grandmother    Hearing loss Maternal Grandmother    Miscarriages / Stillbirths Maternal Grandmother    Heart attack Maternal Grandfather    Hyperlipidemia Maternal Grandfather    Hypertension Maternal Grandfather    Alcohol abuse Paternal Grandmother    Depression Paternal Grandmother    Drug abuse Paternal Grandmother    Heart attack Paternal Grandmother    Hyperlipidemia Paternal Grandmother    Hypertension Paternal Grandmother    Heart disease Paternal Grandmother    Birth  defects Paternal Grandfather    Alcohol abuse Brother    Asthma Brother    Drug abuse Brother    Early death Brother     Allergies  Allergen Reactions   Other Hives   Penicillins Hives   Latex Rash    Medication list has been reviewed and updated.  Current Outpatient Medications on File Prior to Visit  Medication Sig Dispense Refill   celecoxib (CELEBREX) 200 MG capsule Take 200 mg by mouth daily.     Cholecalciferol (VITAMIN D3) 1.25 MG (50000 UT) CAPS Take 1 weekly for 12 weeks 12 capsule 0   lisinopril (ZESTRIL) 20 MG tablet Take 1 tablet (20 mg total) by mouth daily. 90 tablet 3   No current facility-administered medications on  file prior to visit.    Review of Systems:  As per HPI- otherwise negative.   Physical Examination: Vitals:   01/16/21 1422  BP: 124/82  Pulse: 74  Resp: 18  SpO2: 99%   Vitals:   01/16/21 1422  Weight: 260 lb (117.9 kg)  Height: 5\' 3"  (1.6 m)   Body mass index is 46.06 kg/m. Ideal Body Weight: Weight in (lb) to have BMI = 25: 140.8  GEN: no acute distress.  Obese, looks well  HEENT: Atraumatic, Normocephalic.   Bilateral TM wnl, oropharynx normal.  PEERL,EOMI.   Ears and Nose: No external deformity. CV: RRR, No M/G/R. No JVD. No thrill. No extra heart sounds. PULM: CTA B, no wheezes, crackles, rhonchi. No retractions. No resp. distress. No accessory muscle use. ABD: S, NT, ND, +BS. No rebound. No HSM. EXTR: No c/c/e PSYCH: Normally interactive. Conversant.    Assessment and Plan: Physical exam  Vitamin D deficiency - Plan: VITAMIN D 25 Hydroxy (Vit-D Deficiency, Fractures)  Dyslipidemia  Mixed hyperlipidemia  Essential hypertension - Plan: CBC, Comprehensive metabolic panel, lisinopril (ZESTRIL) 20 MG tablet  Screening for thyroid disorder - Plan: TSH  Spondylosis of cervical region without myelopathy or radiculopathy - Plan: celecoxib (CELEBREX) 200 MG capsule  Class 3 severe obesity due to excess calories with serious comorbidity and body mass index (BMI) of 45.0 to 49.9 in adult [facility]) - Plan: Liraglutide -Weight Management (SAXENDA) 18 MG/3ML SOPN, Insulin Pen Needle (PEN NEEDLES) 32G X 4 MM MISC  Physical exam today.  Encouraged healthy diet and exercise routine Will plan further follow- up pending labs. Blood pressures under good control, continue lisinopril Refilled Celebrex to use as needed for cervical degenerative disease.  She can also use Tylenol if she likes  Discussed Saxenda, went over how to start and titrate this medication.  If she does not feel comfortable giving injection we can bring her in for teaching session  I have asked her to see  me in 3 to 4 months to check in how she is doing This visit occurred during the SARS-CoV-2 public health emergency.  Safety protocols were in place, including screening questions prior to the visit, additional usage of staff PPE, and extensive cleaning of exam room while observing appropriate contact time as indicated for disinfecting solutions.   Signed [facility], MD   Received her labs 8/25, message to patient  Results for orders placed or performed in visit on 01/16/21  CBC  Result Value Ref Range   WBC 6.6 4.0 - 10.5 K/uL   RBC 4.40 3.87 - 5.11 Mil/uL   Platelets 356.0 150.0 - 400.0 K/uL   Hemoglobin 13.3 12.0 - 15.0 g/dL   HCT 01/18/21 16.1 - 09.6 %  MCV 89.7 78.0 - 100.0 fl   MCHC 33.7 30.0 - 36.0 g/dL   RDW 32.3 55.7 - 32.2 %  Comprehensive metabolic panel  Result Value Ref Range   Sodium 138 135 - 145 mEq/L   Potassium 4.5 3.5 - 5.1 mEq/L   Chloride 102 96 - 112 mEq/L   CO2 25 19 - 32 mEq/L   Glucose, Bld 89 70 - 99 mg/dL   BUN 14 6 - 23 mg/dL   Creatinine, Ser 0.25 0.40 - 1.20 mg/dL   Total Bilirubin 0.3 0.2 - 1.2 mg/dL   Alkaline Phosphatase 49 39 - 117 U/L   AST 17 0 - 37 U/L   ALT 24 0 - 35 U/L   Total Protein 7.6 6.0 - 8.3 g/dL   Albumin 4.5 3.5 - 5.2 g/dL   GFR 42.70 >62.37 mL/min   Calcium 10.2 8.4 - 10.5 mg/dL  TSH  Result Value Ref Range   TSH 2.67 0.35 - 5.50 uIU/mL  VITAMIN D 25 Hydroxy (Vit-D Deficiency, Fractures)  Result Value Ref Range   VITD 33.14 30.00 - 100.00 ng/mL

## 2021-01-16 ENCOUNTER — Encounter: Payer: Self-pay | Admitting: Orthopaedic Surgery

## 2021-01-16 ENCOUNTER — Other Ambulatory Visit: Payer: Self-pay

## 2021-01-16 ENCOUNTER — Ambulatory Visit: Payer: BLUE CROSS/BLUE SHIELD | Admitting: Orthopaedic Surgery

## 2021-01-16 ENCOUNTER — Ambulatory Visit (INDEPENDENT_AMBULATORY_CARE_PROVIDER_SITE_OTHER): Payer: BLUE CROSS/BLUE SHIELD | Admitting: Family Medicine

## 2021-01-16 VITALS — BP 124/82 | HR 74 | Resp 18 | Ht 63.0 in | Wt 260.0 lb

## 2021-01-16 DIAGNOSIS — E782 Mixed hyperlipidemia: Secondary | ICD-10-CM | POA: Diagnosis not present

## 2021-01-16 DIAGNOSIS — Z1329 Encounter for screening for other suspected endocrine disorder: Secondary | ICD-10-CM | POA: Diagnosis not present

## 2021-01-16 DIAGNOSIS — M47812 Spondylosis without myelopathy or radiculopathy, cervical region: Secondary | ICD-10-CM

## 2021-01-16 DIAGNOSIS — E785 Hyperlipidemia, unspecified: Secondary | ICD-10-CM

## 2021-01-16 DIAGNOSIS — Z6841 Body Mass Index (BMI) 40.0 and over, adult: Secondary | ICD-10-CM

## 2021-01-16 DIAGNOSIS — Z0000 Encounter for general adult medical examination without abnormal findings: Secondary | ICD-10-CM

## 2021-01-16 DIAGNOSIS — M5412 Radiculopathy, cervical region: Secondary | ICD-10-CM | POA: Diagnosis not present

## 2021-01-16 DIAGNOSIS — E559 Vitamin D deficiency, unspecified: Secondary | ICD-10-CM | POA: Diagnosis not present

## 2021-01-16 DIAGNOSIS — I10 Essential (primary) hypertension: Secondary | ICD-10-CM | POA: Diagnosis not present

## 2021-01-16 MED ORDER — SAXENDA 18 MG/3ML ~~LOC~~ SOPN: PEN_INJECTOR | SUBCUTANEOUS | 3 refills | Status: DC

## 2021-01-16 MED ORDER — LISINOPRIL 20 MG PO TABS: 20.0000 mg | ORAL_TABLET | Freq: Every day | ORAL | 3 refills | Status: DC

## 2021-01-16 MED ORDER — PEN NEEDLES 32G X 4 MM MISC: 3 refills | Status: DC

## 2021-01-16 MED ORDER — CELECOXIB 200 MG PO CAPS: 200.0000 mg | ORAL_CAPSULE | Freq: Every day | ORAL | 3 refills | Status: DC

## 2021-01-16 NOTE — Progress Notes (Signed)
Office Visit Note   Patient: Janet Owens           Date of Birth: 1976-04-08           MRN: [id number] Visit Date: 01/16/2021              Requested by: Pearline Cables, MD [address] [place],  [place] [address] PCP: Pearline Cables, MD   Assessment & Plan: Visit Diagnoses:  1. Radiculopathy of cervical spine     Plan: Impression is right upper extremity paresthesias concerning for cervical spine radiculopathy given previous symptoms to the left upper extremity.  I believe she may also have been a component of carpal tunnel syndrome as well.  She has tried not only a steroid pack but home exercise program as well as multiple visits to the chiropractor without relief of symptoms.  We have discussed repeating the steroid taper but she would like to hold off for now.  We would like to go ahead and order a cervical spine MRI to assess for structural abnormalities.  We will also order nerve conduction study of bilateral upper extremities to assess for carpal tunnel syndrome.  She will follow-up with [place] once both of these have been completed.  Follow-Up Instructions: Return for after MRI cervical spine/BUE ncs/emg.   Orders:  No orders of the defined types were placed in this encounter.  No orders of the defined types were placed in this encounter.     Procedures: No procedures performed   Clinical Data: No additional findings.   Subjective: Chief Complaint  Patient presents with   Right Shoulder - Pain   Neck - Pain    HPI patient is a pleasant 45 year old female who comes in today with right upper extremity paresthesias.  She was seen by [place] about 9 months ago with left shoulder calcific rotator cuff tendinitis as well as left upper extremity cervical spine radiculopathy.  She was given a cortisone injection and started on a steroid taper followed by a home exercise program.  She really did not notice much improvement until February when she started  seeing a chiropractor.  The left upper extremity symptoms seemed to resolve.  She then began having paresthesias to the entire right upper extremity which seem to be worse at night as well as with driving.  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  The symptoms she has are a feeling of complete numbness to the entire arm and hand primarily to the thumb and pinky fingers.  She does not take medication for this.  The chiropractor visits have not helped the right upper extremity symptoms.  Review of Systems as detailed in HPI.  All others reviewed and are negative.   Objective: Vital Signs: There were no vitals taken for this visit.  Physical Exam well-developed well-nourished female no acute distress.  Alert and oriented x3.  Ortho Exam cervical spine exam is unremarkable.  She does have a positive Phalen.  Negative Tinel at the wrist.  No focal weakness.  She is neurovascular intact distally.  Specialty Comments:  No specialty comments available.  Imaging: No new imaging   PMFS History: Patient Active Problem List   Diagnosis Date Noted   Vitamin D deficiency 01/12/2021   Essential hypertension 09/05/2019   Hyperlipidemia 09/05/2019   Class 3 severe obesity without serious comorbidity with body mass index (BMI) of 40.0 to 44.9 in adult [facility]) 09/05/2019  Spondylosis of cervical region without myelopathy or radiculopathy 09/05/2019   Past Medical History:  Diagnosis Date   Arthritis    Chickenpox    Hyperlipidemia    Hypertension     Family History  Problem Relation Age of Onset   Arthritis Mother    Hyperlipidemia Mother    Hypertension Mother    Cancer Father    COPD Father    Depression Father    Drug abuse Father    Early death Father    Heart disease Father    Hearing loss Father    Stroke Father    Alcohol abuse Father    Alcohol abuse Maternal Grandmother    Hearing loss Maternal Grandmother    Miscarriages /  Stillbirths Maternal Grandmother    Heart attack Maternal Grandfather    Hyperlipidemia Maternal Grandfather    Hypertension Maternal Grandfather    Alcohol abuse Paternal Grandmother    Depression Paternal Grandmother    Drug abuse Paternal Grandmother    Heart attack Paternal Grandmother    Hyperlipidemia Paternal Grandmother    Hypertension Paternal Grandmother    Heart disease Paternal Grandmother    Birth defects Paternal Grandfather    Alcohol abuse Brother    Asthma Brother    Drug abuse Brother    Early death Brother     History reviewed. No pertinent surgical history. Social History   Occupational History   Not on file  Tobacco Use   Smoking status: Never   Smokeless tobacco: Never  Substance and Sexual Activity   Alcohol use: Yes   Drug use: Never   Sexual activity: Not on file

## 2021-01-16 NOTE — Addendum Note (Signed)
Addended by: Mayra Reel on: 01/16/2021 07:15 PM   Modules accepted: Level of Service

## 2021-01-17 ENCOUNTER — Encounter: Payer: Self-pay | Admitting: Family Medicine

## 2021-01-17 ENCOUNTER — Telehealth: Payer: Self-pay

## 2021-01-17 ENCOUNTER — Telehealth: Payer: Self-pay | Admitting: Orthopaedic Surgery

## 2021-01-17 LAB — CBC
HCT: 39.5 % (ref 36.0–46.0)
Hemoglobin: 13.3 g/dL (ref 12.0–15.0)
MCHC: 33.7 g/dL (ref 30.0–36.0)
MCV: 89.7 fl (ref 78.0–100.0)
Platelets: 356 10*3/uL (ref 150.0–400.0)
RBC: 4.4 Mil/uL (ref 3.87–5.11)
RDW: 13 % (ref 11.5–15.5)
WBC: 6.6 10*3/uL (ref 4.0–10.5)

## 2021-01-17 LAB — COMPREHENSIVE METABOLIC PANEL
ALT: 24 U/L (ref 0–35)
AST: 17 U/L (ref 0–37)
Albumin: 4.5 g/dL (ref 3.5–5.2)
Alkaline Phosphatase: 49 U/L (ref 39–117)
BUN: 14 mg/dL (ref 6–23)
CO2: 25 mEq/L (ref 19–32)
Calcium: 10.2 mg/dL (ref 8.4–10.5)
Chloride: 102 mEq/L (ref 96–112)
Creatinine, Ser: 0.74 mg/dL (ref 0.40–1.20)
GFR: 99.52 mL/min (ref >60.00)
Glucose, Bld: 89 mg/dL (ref 70–99)
Potassium: 4.5 mEq/L (ref 3.5–5.1)
Sodium: 138 mEq/L (ref 135–145)
Total Bilirubin: 0.3 mg/dL (ref 0.2–1.2)
Total Protein: 7.6 g/dL (ref 6.0–8.3)

## 2021-01-17 LAB — TSH: TSH: 2.67 u[IU]/mL (ref 0.35–5.50)

## 2021-01-17 LAB — VITAMIN D 25 HYDROXY (VIT D DEFICIENCY, FRACTURES): VITD: 33.14 ng/mL (ref 30.00–100.00)

## 2021-01-17 NOTE — Telephone Encounter (Signed)
Initiated PA for saxenda via cover my meds.   KEY: [id number]  Outcome: Approved  Start Date:12/18/2020;Coverage End Date:05/17/2021;

## 2021-01-17 NOTE — Telephone Encounter (Signed)
Pt returning shena phone call.   CB [phone]  Didn't mean to put Xu at the top sorry!

## 2021-01-18 ENCOUNTER — Telehealth: Payer: Self-pay | Admitting: Orthopaedic Surgery

## 2021-01-18 NOTE — Telephone Encounter (Signed)
Pt returned shena phone call.   CB [phone]

## 2021-01-20 ENCOUNTER — Ambulatory Visit
Admission: RE | Admit: 2021-01-20 | Discharge: 2021-01-20 | Disposition: A | Discharge status: Home / Self Care | Payer: BLUE CROSS/BLUE SHIELD | Source: Ambulatory Visit | Attending: Family Medicine | Admitting: Family Medicine

## 2021-01-20 ENCOUNTER — Other Ambulatory Visit: Payer: Self-pay

## 2021-01-20 VITALS — BP 128/79 | Temp 98.4°F | Resp 14 | Ht 62.0 in | Wt 260.0 lb

## 2021-01-20 DIAGNOSIS — M5412 Radiculopathy, cervical region: Secondary | ICD-10-CM

## 2021-01-20 MED ORDER — PREDNISONE 10 MG PO TABS: ORAL_TABLET | ORAL | 0 refills | Status: DC

## 2021-01-20 MED ORDER — GABAPENTIN 300 MG PO CAPS: 300.0000 mg | ORAL_CAPSULE | Freq: Three times a day (TID) | ORAL | 0 refills | Status: DC

## 2021-01-20 NOTE — ED Provider Notes (Signed)
[facility]    CSN: [id number] Arrival date & time: 01/20/21  1055      History   Chief Complaint Chief Complaint  Patient presents with   Numbness    Right arm    HPI 45 year old female presents with the above complaint.  Patient is followed by orthopedics.  She has ongoing issues with suspected cervical radiculopathy.  MRI has yet to be scheduled.  She has an upcoming nerve conduction study.  Patient was seen by her orthopedist on 8/24.  She was offered corticosteroids but declined at the time.  Patient states that her symptoms are now worsening.  She is having severe numbness of her right arm.  It is worse at night and she is having difficulty sleeping.  She also reports some pain of her right hand but attributes this to recent installation of multiple shelves at home.  She is in no pain at this time.  Patient would like to discuss treatment options today.  No other complaints or concerns at this time.  Past Medical History:  Diagnosis Date   Arthritis    Chickenpox    Hyperlipidemia    Hypertension     Patient Active Problem List   Diagnosis Date Noted   Vitamin D deficiency 01/12/2021   Essential hypertension 09/05/2019   Hyperlipidemia 09/05/2019   Class 3 severe obesity without serious comorbidity with body mass index (BMI) of 40.0 to 44.9 in adult [facility]) 09/05/2019   Spondylosis of cervical region without myelopathy or radiculopathy 09/05/2019    History reviewed. No pertinent surgical history.  OB History   No obstetric history on file.      Home Medications    Prior to Admission medications   Medication Sig Start Date End Date Taking? Authorizing Provider  celecoxib (CELEBREX) 200 MG capsule Take 1 capsule (200 mg total) by mouth daily. 01/16/21  Yes Copland, Gwenlyn Found, MD  Cholecalciferol (VITAMIN D3) 1.25 MG (50000 UT) CAPS Take 1 weekly for 12 weeks 02/28/20  Yes Copland, Gwenlyn Found, MD  gabapentin (NEURONTIN) 300 MG capsule Take 1 capsule  (300 mg total) by mouth 3 (three) times daily. 01/20/21  Yes Anothy Bufano G, DO  lisinopril (ZESTRIL) 20 MG tablet Take 1 tablet (20 mg total) by mouth daily. 01/16/21  Yes Copland, Gwenlyn Found, MD  predniSONE (DELTASONE) 10 MG tablet 50 mg daily x 2 days, then 40 mg daily x 2 days, then 30 mg daily x 2 days, then 20 mg daily x 2 days, then 10 mg daily x 2 days. 01/20/21  Yes Aster Eckrich G, DO  Insulin Pen Needle (PEN NEEDLES) 32G X 4 MM MISC Use to inject medication daily 01/16/21   Copland, Gwenlyn Found, MD  Liraglutide -Weight Management (SAXENDA) 18 MG/3ML SOPN Inject 0.6 mg daily.  Increase by 0.6 mg weekly to goal dose of 3 mg 01/16/21   Copland, Gwenlyn Found, MD    Family History Family History  Problem Relation Age of Onset   Arthritis Mother    Hyperlipidemia Mother    Hypertension Mother    Cancer Father    COPD Father    Depression Father    Drug abuse Father    Early death Father    Heart disease Father    Hearing loss Father    Stroke Father    Alcohol abuse Father    Alcohol abuse Maternal Grandmother    Hearing loss Maternal Grandmother    Miscarriages / Stillbirths Maternal Grandmother  Heart attack Maternal Grandfather    Hyperlipidemia Maternal Grandfather    Hypertension Maternal Grandfather    Alcohol abuse Paternal Grandmother    Depression Paternal Grandmother    Drug abuse Paternal Grandmother    Heart attack Paternal Grandmother    Hyperlipidemia Paternal Grandmother    Hypertension Paternal Grandmother    Heart disease Paternal Grandmother    Birth defects Paternal Grandfather    Alcohol abuse Brother    Asthma Brother    Drug abuse Brother    Early death Brother     Social History Social History   Tobacco Use   Smoking status: Never    Passive exposure: Never   Smokeless tobacco: Never  Vaping Use   Vaping Use: Never used  Substance Use Topics   Alcohol use: Yes   Drug use: Never     Allergies   Other, Penicillins, and Latex   Review of  Systems Review of Systems Per HPI  Physical Exam Triage Vital Signs ED Triage Vitals  Enc Vitals Group     BP 01/20/21 1108 128/79     Pulse --      Resp 01/20/21 1108 14     Temp 01/20/21 1108 98.4 F (36.9 C)     Temp Source 01/20/21 1108 Oral     SpO2 01/20/21 1108 97 %     Weight 01/20/21 1105 260 lb (117.9 kg)     Height 01/20/21 1105 5\' 2"  (1.575 m)     Head Circumference --      Peak Flow --      Pain Score 01/20/21 1105 0     Pain Loc --      Pain Edu? --      Excl. in GC? --    Updated Vital Signs BP 128/79 (BP Location: Left Arm)   Temp 98.4 F (36.9 C) (Oral)   Resp 14   Ht 5\' 2"  (1.575 m)   Wt 117.9 kg   LMP 01/02/2021 (Exact Date)   SpO2 97%   BMI 47.55 kg/m   Visual Acuity Right Eye Distance:   Left Eye Distance:   Bilateral Distance:    Right Eye Near:   Left Eye Near:    Bilateral Near:     Physical Exam Vitals and nursing note reviewed.  Constitutional:      General: She is not in acute distress.    Appearance: Normal appearance. She is not ill-appearing.  HENT:     Head: Normocephalic and atraumatic.  Cardiovascular:     Rate and Rhythm: Normal rate and regular rhythm.  Pulmonary:     Effort: Pulmonary effort is normal. No respiratory distress.  Neurological:     Mental Status: She is alert.  Psychiatric:        Mood and Affect: Mood normal.        Behavior: Behavior normal.     UC Treatments / Results  Labs (all labs ordered are listed, but only abnormal results are displayed) Labs Reviewed - No data to display  EKG   Radiology No results found.  Procedures Procedures (including critical care time)  Medications Ordered in UC Medications - No data to display  Initial Impression / Assessment and Plan / UC Course  I have reviewed the triage vital signs and the nursing notes.  Pertinent labs & imaging results that were available during my care of the patient were reviewed by me and considered in my medical decision  making (see chart for details).  45 year old female presents with cervical radiculopathy.  Placing on prednisone.  Advised to hold Celebrex.  Gabapentin as prescribed.  Final Clinical Impressions(s) / UC Diagnoses   Final diagnoses:  Cervical radiculopathy     Discharge Instructions      Hold the Celebrex while on the steroid.  Gabapentin as prescribed. Start with it this evening before taking during the day.  Take care  Dr. Adriana Simas    ED Prescriptions     Medication Sig Dispense Auth. Provider   predniSONE (DELTASONE) 10 MG tablet 50 mg daily x 2 days, then 40 mg daily x 2 days, then 30 mg daily x 2 days, then 20 mg daily x 2 days, then 10 mg daily x 2 days. 30 tablet Jaylena Holloway G, DO   gabapentin (NEURONTIN) 300 MG capsule Take 1 capsule (300 mg total) by mouth 3 (three) times daily. 90 capsule Everlene Other G, DO      PDMP not reviewed this encounter.   Tommie Sams, [place] 01/20/21 1157

## 2021-01-20 NOTE — Discharge Instructions (Addendum)
Hold the Celebrex while on the steroid.  Gabapentin as prescribed. Start with it this evening before taking during the day.  Take care  Dr. Adriana Simas

## 2021-01-20 NOTE — ED Triage Notes (Signed)
Patient states that she has arthritis in her neck and bilateral shoulder.  [facility] and is schedules for a MRI and nerve conduction study.  Patient states that she is experiencing right arm numbness while she is sleeping.  Patient states that it wakes her up in the middle of the night.  Patient saw a NP at her Orthopedist on 01/16/21 for her right arm numbness and did not want to a cortisone injection or prescription for prednisone.  Patient also saw her PCP on 01/16/21 for her yearly physical.  Patient states that she needs some medicine to help so that she can sleep better at night.

## 2021-01-21 ENCOUNTER — Telehealth: Payer: Self-pay | Admitting: Orthopaedic Surgery

## 2021-01-21 NOTE — Telephone Encounter (Signed)
Pt's ins calling on behalf of pt. Stating she wants MRI orders sent to [facility]. Pt has an appt set up at the hospital for MRI but wants it sent to them as well to compare who could get her in sooner. Their fax number is [phone]. Pt phone number is [phone].

## 2021-01-22 ENCOUNTER — Telehealth: Payer: Self-pay | Admitting: Orthopaedic Surgery

## 2021-01-22 DIAGNOSIS — L7211 Pilar cyst: Secondary | ICD-10-CM | POA: Diagnosis not present

## 2021-01-22 NOTE — Telephone Encounter (Signed)
Called 1X and left vm for pt to call ans set an appt with Dr. Roda Shutters for MRI review after 01/26/21.

## 2021-01-22 NOTE — Telephone Encounter (Signed)
Pt called wanting to have MRI ordder faxed to [facility] as well. Order has been faxed to [phone]

## 2021-01-23 DIAGNOSIS — M542 Cervicalgia: Secondary | ICD-10-CM | POA: Diagnosis not present

## 2021-01-23 MED ORDER — SAXENDA 18 MG/3ML ~~LOC~~ SOPN: PEN_INJECTOR | SUBCUTANEOUS | 3 refills | Status: DC

## 2021-01-23 NOTE — Addendum Note (Signed)
Addended by: Steve Rattler A on: 01/23/2021 09:25 AM   Modules accepted: Orders

## 2021-01-25 MED ORDER — SAXENDA 18 MG/3ML ~~LOC~~ SOPN
PEN_INJECTOR | SUBCUTANEOUS | 3 refills | Status: DC
Start: 2021-01-25 — End: 2021-06-06

## 2021-01-25 NOTE — Addendum Note (Signed)
Addended by: Steve Rattler A on: 01/25/2021 10:10 AM   Modules accepted: Orders

## 2021-01-26 ENCOUNTER — Ambulatory Visit (HOSPITAL_BASED_OUTPATIENT_CLINIC_OR_DEPARTMENT_OTHER): Payer: BLUE CROSS/BLUE SHIELD

## 2021-02-01 ENCOUNTER — Encounter: Payer: Self-pay | Admitting: Physical Medicine and Rehabilitation

## 2021-02-01 NOTE — Progress Notes (Addendum)
[facility] [address], [address] [place], [place] [address] [phone]  Date:  02/04/2021   Name:  Janet Owens   DOB:  Feb 27, 1976   MRN:  [id number]  PCP:  Pearline Cables, MD    Chief Complaint: No chief complaint on file.   History of Present Illness:  Janet Owens is a 45 y.o. very pleasant female patient who presents with the following:  Pt seen today with concern of HA with sexual activity Last seen by myself for a CPE last month   She has also recently had problems with neck pain and arm numbness- was seen at [facility] on 8/28 for this issue and was treated with gabapentin and prednisone  She is taking gabapentin twice a day She had an MRI of her cervical spine on a disc- I don't have access to this report. She is seeing Dr Roda Shutters (ordering MD) to discuss next week  However, she states this is not the same pain/ headache that brought her in today She first noted headache with sexual activity 8/26 The pain will be in her left neck and into her left head The HA did not start with orgasm- just with clitoral stimulation She has to stop sexual stimulation when the HA occurs  The HA will go away within about 15 minutes if she stops activity right away She has not had this in the past  Most recently occurred about 4 days ago, no HA now  No change in her baseline headache pattern No associated numbness, weakness, vision or hearing change, mental status change   She did not start the saxenda yet  Has her menses right now and IUD in place      Patient Active Problem List   Diagnosis Date Noted   Vitamin D deficiency 01/12/2021   Essential hypertension 09/05/2019   Hyperlipidemia 09/05/2019   Class 3 severe obesity without serious comorbidity with body mass index (BMI) of 40.0 to 44.9 in adult [facility]) 09/05/2019   Spondylosis of cervical region without myelopathy or radiculopathy 09/05/2019    Past Medical History:   Diagnosis Date   Arthritis    Chickenpox    Hyperlipidemia    Hypertension     No past surgical history on file.  Social History   Tobacco Use   Smoking status: Never    Passive exposure: Never   Smokeless tobacco: Never  Vaping Use   Vaping Use: Never used  Substance Use Topics   Alcohol use: Yes   Drug use: Never    Family History  Problem Relation Age of Onset   Arthritis Mother    Hyperlipidemia Mother    Hypertension Mother    Cancer Father    COPD Father    Depression Father    Drug abuse Father    Early death Father    Heart disease Father    Hearing loss Father    Stroke Father    Alcohol abuse Father    Alcohol abuse Maternal Grandmother    Hearing loss Maternal Grandmother    Miscarriages / Stillbirths Maternal Grandmother    Heart attack Maternal Grandfather    Hyperlipidemia Maternal Grandfather    Hypertension Maternal Grandfather    Alcohol abuse Paternal Grandmother    Depression Paternal Grandmother    Drug abuse Paternal Grandmother    Heart attack Paternal Grandmother    Hyperlipidemia Paternal Grandmother    Hypertension Paternal Grandmother  Heart disease Paternal Grandmother    Birth defects Paternal Grandfather    Alcohol abuse Brother    Asthma Brother    Drug abuse Brother    Early death Brother     Allergies  Allergen Reactions   Other Hives   Penicillins Hives   Latex Rash    Medication list has been reviewed and updated.  Current Outpatient Medications on File Prior to Visit  Medication Sig Dispense Refill   celecoxib (CELEBREX) 200 MG capsule Take 1 capsule (200 mg total) by mouth daily. 90 capsule 3   Cholecalciferol (VITAMIN D3) 1.25 MG (50000 UT) CAPS Take 1 weekly for 12 weeks 12 capsule 0   gabapentin (NEURONTIN) 300 MG capsule Take 1 capsule (300 mg total) by mouth 3 (three) times daily. 90 capsule 0   Insulin Pen Needle (PEN NEEDLES) 32G X 4 MM MISC Use to inject medication daily 100 each 3   lisinopril  (ZESTRIL) 20 MG tablet Take 1 tablet (20 mg total) by mouth daily. 90 tablet 3   predniSONE (DELTASONE) 10 MG tablet 50 mg daily x 2 days, then 40 mg daily x 2 days, then 30 mg daily x 2 days, then 20 mg daily x 2 days, then 10 mg daily x 2 days. 30 tablet 0   Liraglutide -Weight Management (SAXENDA) 18 MG/3ML SOPN Inject 0.6 mg daily.  Increase by 0.6 mg weekly to goal dose of 3 mg (Patient not taking: Reported on 02/04/2021) 45 mL 3   No current facility-administered medications on file prior to visit.    Review of Systems:  As per HPI- otherwise negative.   Physical Examination: Vitals:   02/04/21 1101  BP: 138/82  Pulse: 60  Resp: 18  Temp: (!) 97.3 F (36.3 C)  SpO2: 98%   Vitals:   02/04/21 1101  Weight: 255 lb (115.7 kg)  Height: 5\' 2"  (1.575 m)   Body mass index is 46.64 kg/m. Ideal Body Weight: Weight in (lb) to have BMI = 25: 136.4  GEN: no acute distress.  Obese, ow looks well  HEENT: Atraumatic, Normocephalic.   Bilateral TM wnl, oropharynx normal.  PEERL,EOMI.   I am not able to reproduce the pain in her left neck/ head by moving her head or neck, pressing on her head or neck  Ears and Nose: No external deformity. CV: RRR, No M/G/R. No JVD. No thrill. No extra heart sounds. PULM: CTA B, no wheezes, crackles, rhonchi. No retractions. No resp. distress. No accessory muscle use. EXTR: No c/c/e PSYCH: Normally interactive. Conversant.  Normal strength, sensation and DTR of all limbs Normal gait   Assessment and Plan: Headache associated with sexual activity - Plan: Basic metabolic panel, CT ANGIO HEAD NECK W WO CM  Dyslipidemia - Plan: Lipid panel Pt seen today with pre-orgasm sexual HA She is concerned and would like to proceed with CT head/ neck to rule out any vascular cause Ordered CT today- she will avoid sexual stimulation for now and will go to ER if HA comes back or is worse   This visit occurred during the SARS-CoV-2 public health emergency.  Safety  protocols were in place, including screening questions prior to the visit, additional usage of staff PPE, and extensive cleaning of exam room while observing appropriate contact time as indicated for disinfecting solutions.   Signed M, MD  Received labs as below, message to pt  Results for orders placed or performed in visit on 02/04/21  Basic metabolic panel  Result Value Ref Range   Sodium 138 135 - 145 mEq/L   Potassium 4.2 3.5 - 5.1 mEq/L   Chloride 103 96 - 112 mEq/L   CO2 26 19 - 32 mEq/L   Glucose, Bld 81 70 - 99 mg/dL   BUN 11 6 - 23 mg/dL   Creatinine, Ser 2.87 0.40 - 1.20 mg/dL   GFR 68.11 >57.26 mL/min   Calcium 9.5 8.4 - 10.5 mg/dL  Lipid panel  Result Value Ref Range   Cholesterol 228 (H) 0 - 200 mg/dL   Triglycerides 20.3 0.0 - 149.0 mg/dL   HDL 55.97 >41.63 mg/dL   VLDL 84.5 0.0 - 36.4 mg/dL   LDL Cholesterol 680 (H) 0 - 99 mg/dL   Total CHOL/HDL Ratio 5    NonHDL 181.05     The 10-year ASCVD risk score (Arnett DK, et al., 2019) is: 1.7%   Values used to calculate the score:     Age: 80 years     Sex: Female     Is Non-Hispanic African American: No     Diabetic: No     Tobacco smoker: No     Systolic Blood Pressure: 138 mmHg     Is BP treated: Yes     HDL Cholesterol: 47.4 mg/dL     Total Cholesterol: 228 mg/dL

## 2021-02-04 ENCOUNTER — Encounter: Payer: Self-pay | Admitting: Orthopaedic Surgery

## 2021-02-04 ENCOUNTER — Encounter: Payer: Self-pay | Admitting: Family Medicine

## 2021-02-04 ENCOUNTER — Ambulatory Visit: Payer: BLUE CROSS/BLUE SHIELD | Admitting: Family Medicine

## 2021-02-04 ENCOUNTER — Other Ambulatory Visit: Payer: Self-pay

## 2021-02-04 VITALS — BP 138/82 | HR 60 | Temp 97.3°F | Resp 18 | Ht 62.0 in | Wt 255.0 lb

## 2021-02-04 DIAGNOSIS — G4482 Headache associated with sexual activity: Secondary | ICD-10-CM

## 2021-02-04 DIAGNOSIS — E785 Hyperlipidemia, unspecified: Secondary | ICD-10-CM

## 2021-02-04 LAB — LIPID PANEL
Cholesterol: 228 mg/dL — ABNORMAL HIGH (ref 0–200)
HDL: 47.4 mg/dL (ref >39.00)
LDL Cholesterol: 168 mg/dL — ABNORMAL HIGH (ref 0–99)
NonHDL: 181.05
Total CHOL/HDL Ratio: 5
Triglycerides: 64 mg/dL (ref 0.0–149.0)
VLDL: 12.8 mg/dL (ref 0.0–40.0)

## 2021-02-04 LAB — BASIC METABOLIC PANEL
BUN: 11 mg/dL (ref 6–23)
CO2: 26 mEq/L (ref 19–32)
Calcium: 9.5 mg/dL (ref 8.4–10.5)
Chloride: 103 mEq/L (ref 96–112)
Creatinine, Ser: 0.75 mg/dL (ref 0.40–1.20)
GFR: 97.9 mL/min (ref >60.00)
Glucose, Bld: 81 mg/dL (ref 70–99)
Potassium: 4.2 mEq/L (ref 3.5–5.1)
Sodium: 138 mEq/L (ref 135–145)

## 2021-02-04 NOTE — Patient Instructions (Signed)
Good to see you today! Please stop by lab and then go to the ground floor imaging dept to schedule your CT. Ok to do a different location if they can get you in faster

## 2021-02-05 ENCOUNTER — Encounter: Payer: Self-pay | Admitting: Physical Medicine and Rehabilitation

## 2021-02-05 ENCOUNTER — Ambulatory Visit: Payer: BLUE CROSS/BLUE SHIELD | Admitting: Physical Medicine and Rehabilitation

## 2021-02-05 DIAGNOSIS — R202 Paresthesia of skin: Secondary | ICD-10-CM | POA: Diagnosis not present

## 2021-02-05 NOTE — Progress Notes (Signed)
Right arm numbness at night. History of shoulder arthritis on left- pain in left, but patient reports that most symptoms are on the right.  Right hand dominant No lotion per patient

## 2021-02-05 NOTE — Progress Notes (Signed)
Janet Owens - 45 y.o. female MRN [id number]  Date of birth: 1975/09/21  Office Visit Note: Visit Date: 02/05/2021 PCP: Pearline Cables, MD Referred by: Pearline Cables, MD  Subjective: Chief Complaint  Patient presents with   Right Arm - Numbness   HPI:  Janet Owens is a 45 y.o. female who comes in today at the request of Dr. Glee Arvin for electrodiagnostic study of the Bilateral upper extremities.  Patient is Right hand dominant.  She was seen by Dr. Roda Shutters recently with entire right arm paresthesias in a nondermatomal pattern.  History of left-sided findings many months ago.  His concern was for potential for cervical radiculopathy with a component of carpal tunnel syndrome.  She has not had prior electrodiagnostic studies.  Her last hemoglobin A1c was normal.  She has had MRI of the cervical spine ordered by Dr. Roda Shutters and she has follow up to review.  ROS Otherwise per HPI.  Assessment & Plan: Visit Diagnoses:    ICD-10-CM   1. Paresthesia of skin  R20.2 NCV with EMG (electromyography)      Plan:  Impression: The above electrodiagnostic study is ABNORMAL and reveals evidence of a mild to moderate right median nerve entrapment at the wrist (carpal tunnel syndrome) affecting sensory components.  This would not necessarily explain the totality of her symptoms.   There is no significant electrodiagnostic evidence of any other focal nerve entrapment, brachial plexopathy or cervical radiculopathy.  As you know, this particular electrodiagnostic study cannot rule out chemical radiculitis or sensory only radiculopathy. **This electrodiagnostic study cannot rule out small fiber polyneuropathy and dysesthesias from central pain syndromes such as stroke or central pain sensitization syndromes such as fibromyalgia.  Myotomal referral pain from trigger points is also not excluded.  Recommendations: 1.  Follow-up with referring physician. 2.  Continue current management. 3.  Suggest  diagnostic carpal tunnel injection.  Meds & Orders: No orders of the defined types were placed in this encounter.   Orders Placed This Encounter  Procedures   NCV with EMG (electromyography)    Follow-up: Return if symptoms worsen or fail to improve, for Glee Arvin, MD as scheduled.   Procedures: No procedures performed  EMG & NCV Findings: Evaluation of the right median (across palm) sensory nerve showed prolonged distal peak latency (Wrist, 4.2 ms).  All remaining nerves (as indicated in the following tables) were within normal limits.  Left vs. Right side comparison data for the median motor nerve indicates abnormal L-R latency difference (1.1 ms).    All examined muscles (as indicated in the following table) showed no evidence of electrical instability.    Impression: The above electrodiagnostic study is ABNORMAL and reveals evidence of a mild to moderate right median nerve entrapment at the wrist (carpal tunnel syndrome) affecting sensory components.  This would not necessarily explain the totality of her symptoms.   There is no significant electrodiagnostic evidence of any other focal nerve entrapment, brachial plexopathy or cervical radiculopathy.  As you know, this particular electrodiagnostic study cannot rule out chemical radiculitis or sensory only radiculopathy. **This electrodiagnostic study cannot rule out small fiber polyneuropathy and dysesthesias from central pain syndromes such as stroke or central pain sensitization syndromes such as fibromyalgia.  Myotomal referral pain from trigger points is also not excluded.  Recommendations: 1.  Follow-up with referring physician. 2.  Continue current management. 3.  Suggest diagnostic carpal tunnel injection.  ___________________________ [name] Board Certified, American Board of Physical Medicine and Rehabilitation  Nerve Conduction Studies Anti Sensory Summary Table   Stim Site NR Peak (ms) Norm Peak (ms)  P-T Amp (V) Norm P-T Amp Site1 Site2 Delta-P (ms) Dist (cm) Vel (m/s) Norm Vel (m/s)  Left Median Acr Palm Anti Sensory (2nd Digit)  29C  Wrist    3.2 <3.6 33.9 >10 Wrist Palm 1.6 0.0    Palm    1.6 <2.0 38.4         Right Median Acr Palm Anti Sensory (2nd Digit)  30.8C  Wrist    *4.2 <3.6 21.5 >10 Wrist Palm 2.5 0.0    Palm    1.7 <2.0 21.9         Right Radial Anti Sensory (Base 1st Digit)  30.6C  Wrist    1.9 <3.1 21.9  Wrist Base 1st Digit 1.9 0.0    Right Ulnar Anti Sensory (5th Digit)  31.1C  Wrist    3.1 <3.7 29.2 >15.0 Wrist 5th Digit 3.1 14.0 45 >38   Motor Summary Table   Stim Site NR Onset (ms) Norm Onset (ms) O-P Amp (mV) Norm O-P Amp Site1 Site2 Delta-0 (ms) Dist (cm) Vel (m/s) Norm Vel (m/s)  Left Median Motor (Abd Poll Brev)  30.3C  Wrist    3.1 <4.2 8.2 >5 Elbow Wrist 3.5 18.7 53 >50  Elbow    6.6  7.9         Right Median Motor (Abd Poll Brev)  30.6C  Wrist    4.2 <4.2 5.2 >5 Elbow Wrist 3.9 19.5 50 >50  Elbow    8.1  3.8         Right Ulnar Motor (Abd Dig Min)  30.7C  Wrist    2.5 <4.2 10.2 >3 B Elbow Wrist 3.0 18.0 60 >53  B Elbow    5.5  9.9  A Elbow B Elbow 1.2 10.0 83 >53  A Elbow    6.7  9.9          EMG   Side Muscle Nerve Root Ins Act Fibs Psw Amp Dur Poly Recrt Int Dennie Bible Comment  Right Abd Poll Brev Median C8-T1 Nml Nml Nml Nml Nml 0 Nml Nml   Right 1stDorInt Ulnar C8-T1 Nml Nml Nml Nml Nml 0 Nml Nml   Right PronatorTeres Median C6-7 Nml Nml Nml Nml Nml 0 Nml Nml   Right Biceps Musculocut C5-6 Nml Nml Nml Nml Nml 0 Nml Nml   Right Deltoid Axillary C5-6 Nml Nml Nml Nml Nml 0 Nml Nml     Nerve Conduction Studies Anti Sensory Left/Right Comparison   Stim Site L Lat (ms) R Lat (ms) L-R Lat (ms) L Amp (V) R Amp (V) L-R Amp (%) Site1 Site2 L Vel (m/s) R Vel (m/s) L-R Vel (m/s)  Median Acr Palm Anti Sensory (2nd Digit)  29C  Wrist 3.2 *4.2 1.0 33.9 21.5 36.6 Wrist Palm     Palm 1.6 1.7 0.1 38.4 21.9 43.0       Radial Anti Sensory (Base 1st Digit)   30.6C  Wrist  1.9   21.9  Wrist Base 1st Digit     Ulnar Anti Sensory (5th Digit)  31.1C  Wrist  3.1   29.2  Wrist 5th Digit  45    Motor Left/Right Comparison   Stim Site L Lat (ms) R Lat (ms) L-R Lat (ms) L Amp (mV) R Amp (mV) L-R Amp (%) Site1 Site2 L Vel (m/s) R Vel (m/s) L-R Vel (m/s)  Median Motor (Abd Poll Brev)  30.3C  Wrist 3.1 4.2 *1.1 8.2 5.2 36.6 Elbow Wrist 53 50 3  Elbow 6.6 8.1 1.5 7.9 3.8 51.9       Ulnar Motor (Abd Dig Min)  30.7C  Wrist  2.5   10.2  B Elbow Wrist  60   B Elbow  5.5   9.9  A Elbow B Elbow  83   A Elbow  6.7   9.9           Waveforms:                Clinical History: MRI CERVICAL SPINE WITHOUT CONTRAST  TECHNIQUE: Multiplanar, multisequence MR imaging of the cervical spine was performed. No intravenous contrast was administered.  COMPARISON: Radiographs of the cervical spine 04/03/2020 (images available, report unavailable).  FINDINGS: Alignment: Straightening of the expected cervical lordosis. No significant spondylolisthesis.  Vertebrae: Vertebral body height is maintained. No significant marrow edema or focal suspicious osseous lesion.  Cord: No spinal cord signal abnormality is identified.  Posterior Fossa, vertebral arteries, paraspinal tissues: No abnormality identified within included portions of the posterior fossa. Flow voids preserved within the imaged cervical vertebral arteries. Paraspinal soft tissues unremarkable.  Disc levels:  Minimal multilevel disc degeneration, greatest at C6-C7. No significant disc herniation, spinal canal stenosis or neural foraminal narrowing.  IMPRESSION: Minimal multilevel disc degeneration, greatest at C6-C7. No significant disc herniation, spinal canal stenosis or neural foraminal narrowing.  Nonspecific straightening of the expected cervical lordosis.   Electronically Signed By: Jackey Loge D.O. On: 01/23/2021 17:46     Objective:  VS:  HT:    WT:   BMI:     BP:    HR: bpm  TEMP: ( )  RESP:  Physical Exam Musculoskeletal:        General: No swelling, tenderness or deformity.     Comments: Inspection reveals no atrophy of the bilateral APB or FDI or hand intrinsics. There is no swelling, color changes, allodynia or dystrophic changes. There is 5 out of 5 strength in the bilateral wrist extension, finger abduction and long finger flexion. There is intact sensation to light touch in all dermatomal and peripheral nerve distributions. There is a negative Hoffmann's test bilaterally.  Skin:    General: Skin is warm and dry.     Findings: No erythema or rash.  Neurological:     General: No focal deficit present.     Mental Status: She is alert and oriented to person, place, and time.     Motor: No weakness or abnormal muscle tone.     Coordination: Coordination normal.  Psychiatric:        Mood and Affect: Mood normal.        Behavior: Behavior normal.     Imaging: No results found.

## 2021-02-06 NOTE — Procedures (Signed)
EMG & NCV Findings: Evaluation of the right median (across palm) sensory nerve showed prolonged distal peak latency (Wrist, 4.2 ms).  All remaining nerves (as indicated in the following tables) were within normal limits.  Left vs. Right side comparison data for the median motor nerve indicates abnormal L-R latency difference (1.1 ms).    All examined muscles (as indicated in the following table) showed no evidence of electrical instability.    Impression: The above electrodiagnostic study is ABNORMAL and reveals evidence of a mild to moderate right median nerve entrapment at the wrist (carpal tunnel syndrome) affecting sensory components.  This would not necessarily explain the totality of her symptoms.   There is no significant electrodiagnostic evidence of any other focal nerve entrapment, brachial plexopathy or cervical radiculopathy.  As you know, this particular electrodiagnostic study cannot rule out chemical radiculitis or sensory only radiculopathy. **This electrodiagnostic study cannot rule out small fiber polyneuropathy and dysesthesias from central pain syndromes such as stroke or central pain sensitization syndromes such as fibromyalgia.  Myotomal referral pain from trigger points is also not excluded.  Recommendations: 1.  Follow-up with referring physician. 2.  Continue current management. 3.  Suggest diagnostic carpal tunnel injection.  ___________________________ Naaman Plummer FAAPMR Board Certified, American Board of Physical Medicine and Rehabilitation    Nerve Conduction Studies Anti Sensory Summary Table   Stim Site NR Peak (ms) Norm Peak (ms) P-T Amp (V) Norm P-T Amp Site1 Site2 Delta-P (ms) Dist (cm) Vel (m/s) Norm Vel (m/s)  Left Median Acr Palm Anti Sensory (2nd Digit)  29C  Wrist    3.2 <3.6 33.9 >10 Wrist Palm 1.6 0.0    Palm    1.6 <2.0 38.4         Right Median Acr Palm Anti Sensory (2nd Digit)  30.8C  Wrist    *4.2 <3.6 21.5 >10 Wrist Palm 2.5 0.0    Palm     1.7 <2.0 21.9         Right Radial Anti Sensory (Base 1st Digit)  30.6C  Wrist    1.9 <3.1 21.9  Wrist Base 1st Digit 1.9 0.0    Right Ulnar Anti Sensory (5th Digit)  31.1C  Wrist    3.1 <3.7 29.2 >15.0 Wrist 5th Digit 3.1 14.0 45 >38   Motor Summary Table   Stim Site NR Onset (ms) Norm Onset (ms) O-P Amp (mV) Norm O-P Amp Site1 Site2 Delta-0 (ms) Dist (cm) Vel (m/s) Norm Vel (m/s)  Left Median Motor (Abd Poll Brev)  30.3C  Wrist    3.1 <4.2 8.2 >5 Elbow Wrist 3.5 18.7 53 >50  Elbow    6.6  7.9         Right Median Motor (Abd Poll Brev)  30.6C  Wrist    4.2 <4.2 5.2 >5 Elbow Wrist 3.9 19.5 50 >50  Elbow    8.1  3.8         Right Ulnar Motor (Abd Dig Min)  30.7C  Wrist    2.5 <4.2 10.2 >3 B Elbow Wrist 3.0 18.0 60 >53  B Elbow    5.5  9.9  A Elbow B Elbow 1.2 10.0 83 >53  A Elbow    6.7  9.9          EMG   Side Muscle Nerve Root Ins Act Fibs Psw Amp Dur Poly Recrt Int Dennie Bible Comment  Right Abd Poll Brev Median C8-T1 Nml Nml Nml Nml Nml 0 Nml Nml   Right  1stDorInt Ulnar C8-T1 Nml Nml Nml Nml Nml 0 Nml Nml   Right PronatorTeres Median C6-7 Nml Nml Nml Nml Nml 0 Nml Nml   Right Biceps Musculocut C5-6 Nml Nml Nml Nml Nml 0 Nml Nml   Right Deltoid Axillary C5-6 Nml Nml Nml Nml Nml 0 Nml Nml     Nerve Conduction Studies Anti Sensory Left/Right Comparison   Stim Site L Lat (ms) R Lat (ms) L-R Lat (ms) L Amp (V) R Amp (V) L-R Amp (%) Site1 Site2 L Vel (m/s) R Vel (m/s) L-R Vel (m/s)  Median Acr Palm Anti Sensory (2nd Digit)  29C  Wrist 3.2 *4.2 1.0 33.9 21.5 36.6 Wrist Palm     Palm 1.6 1.7 0.1 38.4 21.9 43.0       Radial Anti Sensory (Base 1st Digit)  30.6C  Wrist  1.9   21.9  Wrist Base 1st Digit     Ulnar Anti Sensory (5th Digit)  31.1C  Wrist  3.1   29.2  Wrist 5th Digit  45    Motor Left/Right Comparison   Stim Site L Lat (ms) R Lat (ms) L-R Lat (ms) L Amp (mV) R Amp (mV) L-R Amp (%) Site1 Site2 L Vel (m/s) R Vel (m/s) L-R Vel (m/s)  Median Motor (Abd Poll Brev)   30.3C  Wrist 3.1 4.2 *1.1 8.2 5.2 36.6 Elbow Wrist 53 50 3  Elbow 6.6 8.1 1.5 7.9 3.8 51.9       Ulnar Motor (Abd Dig Min)  30.7C  Wrist  2.5   10.2  B Elbow Wrist  60   B Elbow  5.5   9.9  A Elbow B Elbow  83   A Elbow  6.7   9.9           Waveforms:

## 2021-02-07 ENCOUNTER — Telehealth: Payer: Self-pay | Admitting: Family Medicine

## 2021-02-07 ENCOUNTER — Encounter: Payer: Self-pay | Admitting: Orthopaedic Surgery

## 2021-02-07 ENCOUNTER — Encounter: Payer: Self-pay | Admitting: Neurology

## 2021-02-07 ENCOUNTER — Ambulatory Visit: Payer: BLUE CROSS/BLUE SHIELD | Admitting: Orthopaedic Surgery

## 2021-02-07 ENCOUNTER — Encounter: Payer: Self-pay | Admitting: Family Medicine

## 2021-02-07 ENCOUNTER — Other Ambulatory Visit: Payer: Self-pay

## 2021-02-07 DIAGNOSIS — M5412 Radiculopathy, cervical region: Secondary | ICD-10-CM | POA: Diagnosis not present

## 2021-02-07 NOTE — Progress Notes (Signed)
Office Visit Note   Patient: Janet Owens           Date of Birth: 1975/11/23           MRN: [id number] Visit Date: 02/07/2021              Requested by: Pearline Cables, MD [address] [place],  [place] [address] PCP: Pearline Cables, MD   Assessment & Plan: Visit Diagnoses:  1. Radiculopathy of cervical spine     Plan: Impression is right upper extremity paresthesias, primarily numbness.  Although the patient's nerve conduction study shows mild to moderate compression of the median nerve, I do not believe this accounts for the majority of her symptoms.  Her MRI was somewhat unremarkable with the exception of mild degeneration C6-7.  We have discussed proceeding with a diagnostic and possibly therapeutic carpal tunnel injection versus referral to neurology for further work-up as I believe there is more going on.  She mentioned she has recently had thunderclap headaches and significant worsening of the numbness with mild relief with gabapentin.  Her primary care provider is currently referring her out for CT scan.  We will go ahead make a referral to neurology.  We have also discussed with the patient that we will fax the radiologist interpretation of her cervical spine MRI to her primary care doctor.  She will follow-up with [place] as needed.  Call with concerns or questions in the meantime.  Follow-Up Instructions: Return if symptoms worsen or fail to improve.   Orders:  Orders Placed This Encounter  Procedures   Ambulatory referral to Neurology   No orders of the defined types were placed in this encounter.     Procedures: No procedures performed   Clinical Data: No additional findings.   Subjective: Chief Complaint  Patient presents with   Neck - Pain    HPI patient is a pleasant 45 year old female who comes in today to discuss nerve conduction study right upper extremity in addition to MRI of the cervical spine.  This is due to the patient  having continued right arm paresthesias, primarily numbness which is recently worsened.  The patient is now having thunderclap headaches and is having trouble sleeping.  She has recently been prescribed gabapentin which is somewhat helpful.  Nerve conduction study right upper extremity shows a mild to moderate compression of the median nerve.  Also mentioned that it cannot rule out a small fiber polyneuropathy and dysesthesias from central pain syndromes.  MRI of the cervical spine showed minimal multilevel disc degeneration greatest at C6-7.  No other structural abnormalities noted.     Objective: Vital Signs: There were no vitals taken for this visit.    Ortho Exam unchanged right upper extremity exam  Specialty Comments:  No specialty comments available.  Imaging: No new imaging   PMFS History: Patient Active Problem List   Diagnosis Date Noted   Vitamin D deficiency 01/12/2021   Essential hypertension 09/05/2019   Hyperlipidemia 09/05/2019   Class 3 severe obesity without serious comorbidity with body mass index (BMI) of 40.0 to 44.9 in adult [facility]) 09/05/2019   Spondylosis of cervical region without myelopathy or radiculopathy 09/05/2019   Past Medical History:  Diagnosis Date   Arthritis    Chickenpox    Hyperlipidemia    Hypertension     Family History  Problem Relation Age of Onset   Arthritis Mother    Hyperlipidemia Mother    Hypertension Mother  Cancer Father    COPD Father    Depression Father    Drug abuse Father    Early death Father    Heart disease Father    Hearing loss Father    Stroke Father    Alcohol abuse Father    Alcohol abuse Maternal Grandmother    Hearing loss Maternal Grandmother    Miscarriages / Stillbirths Maternal Grandmother    Heart attack Maternal Grandfather    Hyperlipidemia Maternal Grandfather    Hypertension Maternal Grandfather    Alcohol abuse Paternal Grandmother    Depression Paternal Grandmother    Drug abuse  Paternal Grandmother    Heart attack Paternal Grandmother    Hyperlipidemia Paternal Grandmother    Hypertension Paternal Grandmother    Heart disease Paternal Grandmother    Birth defects Paternal Grandfather    Alcohol abuse Brother    Asthma Brother    Drug abuse Brother    Early death Brother     History reviewed. No pertinent surgical history. Social History   Occupational History   Not on file  Tobacco Use   Smoking status: Never    Passive exposure: Never   Smokeless tobacco: Never  Vaping Use   Vaping Use: Never used  Substance and Sexual Activity   Alcohol use: Yes   Drug use: Never   Sexual activity: Not on file

## 2021-02-07 NOTE — Telephone Encounter (Signed)
Called the [employer] plan and her MRI is now approved  9/14- 03/07/21 Approval #[id number] Updated pt

## 2021-02-07 NOTE — Telephone Encounter (Signed)
Spoke with Gwen-she states CT was denied. She states she sent you information to complete peer to peer in separate encounter. Please let me know if you need anything from me. I have informed patient of current status.

## 2021-02-07 NOTE — Telephone Encounter (Signed)
Pt. Called and stated that she needs a PA for her CT scan in order to get it done.

## 2021-02-08 ENCOUNTER — Ambulatory Visit (HOSPITAL_BASED_OUTPATIENT_CLINIC_OR_DEPARTMENT_OTHER)
Admission: RE | Admit: 2021-02-08 | Discharge: 2021-02-08 | Disposition: A | Discharge status: Home / Self Care | Payer: BLUE CROSS/BLUE SHIELD | Source: Ambulatory Visit | Attending: Family Medicine | Admitting: Family Medicine

## 2021-02-08 DIAGNOSIS — I6603 Occlusion and stenosis of bilateral middle cerebral arteries: Secondary | ICD-10-CM | POA: Diagnosis not present

## 2021-02-08 DIAGNOSIS — G4482 Headache associated with sexual activity: Secondary | ICD-10-CM | POA: Diagnosis not present

## 2021-02-08 DIAGNOSIS — I672 Cerebral atherosclerosis: Secondary | ICD-10-CM | POA: Diagnosis not present

## 2021-02-08 DIAGNOSIS — I6523 Occlusion and stenosis of bilateral carotid arteries: Secondary | ICD-10-CM | POA: Diagnosis not present

## 2021-02-08 DIAGNOSIS — G930 Cerebral cysts: Secondary | ICD-10-CM | POA: Diagnosis not present

## 2021-02-08 MED ORDER — IOHEXOL 350 MG/ML SOLN
100.0000 mL | Freq: Once | INTRAVENOUS | Status: AC | PRN
  Administered 2021-02-08: 100 mL via INTRAVENOUS

## 2021-02-11 ENCOUNTER — Encounter: Payer: Self-pay | Admitting: Family Medicine

## 2021-02-11 ENCOUNTER — Other Ambulatory Visit: Payer: Self-pay | Admitting: Family Medicine

## 2021-02-11 DIAGNOSIS — G4482 Headache associated with sexual activity: Secondary | ICD-10-CM

## 2021-02-12 ENCOUNTER — Other Ambulatory Visit: Payer: Self-pay | Admitting: Family Medicine

## 2021-02-12 ENCOUNTER — Encounter: Payer: Self-pay | Admitting: Family Medicine

## 2021-02-12 DIAGNOSIS — G4482 Headache associated with sexual activity: Secondary | ICD-10-CM

## 2021-02-12 MED ORDER — ROSUVASTATIN CALCIUM 20 MG PO TABS: 20.0000 mg | ORAL_TABLET | Freq: Every day | ORAL | 3 refills | Status: DC

## 2021-02-12 NOTE — Addendum Note (Signed)
Addended by: Pearline Cables on: 02/12/2021 06:29 PM   Modules accepted: Orders

## 2021-02-13 DIAGNOSIS — I672 Cerebral atherosclerosis: Secondary | ICD-10-CM | POA: Diagnosis not present

## 2021-02-13 DIAGNOSIS — G4482 Headache associated with sexual activity: Secondary | ICD-10-CM | POA: Diagnosis not present

## 2021-02-13 DIAGNOSIS — Q046 Congenital cerebral cysts: Secondary | ICD-10-CM | POA: Diagnosis not present

## 2021-02-13 DIAGNOSIS — E348 Other specified endocrine disorders: Secondary | ICD-10-CM | POA: Diagnosis not present

## 2021-03-06 ENCOUNTER — Other Ambulatory Visit: Payer: Self-pay

## 2021-03-06 ENCOUNTER — Encounter: Payer: Self-pay | Admitting: Family Medicine

## 2021-03-06 ENCOUNTER — Ambulatory Visit: Payer: BLUE CROSS/BLUE SHIELD | Admitting: Neurology

## 2021-03-06 ENCOUNTER — Encounter: Payer: Self-pay | Admitting: Neurology

## 2021-03-06 VITALS — BP 139/83 | HR 78 | Ht 62.0 in | Wt 260.0 lb

## 2021-03-06 DIAGNOSIS — G4482 Headache associated with sexual activity: Secondary | ICD-10-CM

## 2021-03-06 DIAGNOSIS — I679 Cerebrovascular disease, unspecified: Secondary | ICD-10-CM | POA: Diagnosis not present

## 2021-03-06 MED ORDER — ROSUVASTATIN CALCIUM 40 MG PO TABS: 40.0000 mg | ORAL_TABLET | Freq: Every day | ORAL | 3 refills | Status: DC

## 2021-03-06 MED ORDER — CLOPIDOGREL BISULFATE 75 MG PO TABS: 75.0000 mg | ORAL_TABLET | Freq: Every day | ORAL | 0 refills | Status: DC

## 2021-03-06 MED ORDER — PROPRANOLOL HCL 20 MG PO TABS: ORAL_TABLET | ORAL | 1 refills | Status: DC

## 2021-03-06 MED ORDER — GABAPENTIN 300 MG PO CAPS: 300.0000 mg | ORAL_CAPSULE | Freq: Two times a day (BID) | ORAL | 3 refills | Status: DC

## 2021-03-06 NOTE — Patient Instructions (Addendum)
MRI brain without contrast  Start plavix 75mg  daily for 3 months  Continue aspirin 81mg  daily  For headaches, you can propranolol 20-40mg  as needed  Return to clinic in 3 months

## 2021-03-06 NOTE — Progress Notes (Signed)
[facility] Neurology [facility] Clinic Note - Initial Visit   Date: 03/06/21  Janet Owens MRN: [id number] DOB: 09/13/1975   Dear Dr. Roda Shutters:  Thank you for your kind referral of Janet Owens for consultation of right arm numbness. Although her history is well known to you, please allow Janet Owens to reiterate it for the purpose of our medical record. The patient was accompanied to the clinic by self.   History of Present Illness: Janet Owens is a 45 y.o. right-handed female with hypertension and hyperlipidemia presenting for evaluation of right arm paresthesias and headaches.   Starting in February 2022, she began having spells of right arm numbness which would wake her up from sleeping.  She tried various exercises to get relief.  It generally lasted about 10-30 minutes. In August, she began having worsening symptoms occurring every time she was trying to lay down or even with standing.  She was started on gabapentin 300mg  twice daily which has helped.  She still wakes up with her arm numb, but she is able to rest and go to sleep.    Also in August, she began having headaches with intercourse.  She complains of "thunderclap headache".  Pain is left sided headache, described as stabbing pain and lasts 30-minutes.  She complains of headaches almost daily headache.  She takes tylenol for headache.   She denies right face or leg parethesia.  No left sided symptoms.  Father has stroke at the age of 8.   She underwent MRI cervical spine in August which did not show any nerve impingement.  NCS/EMG of the right arm showed mild CTS.  CTA head was performed due to headaches and shows bilateral M2 MCA stenosis, worse on the left, as well as calcified coilloid cyst and pineal cyst.  She was evaluated by neurosurgery who recommended repeat imaging of the cyst in 6 months and follow-up here.   Out-side paper records, electronic medical record, and images have been reviewed where available and  summarized as:  CTA head and neck 02/08/2021: 1. No evidence of acute intracranial abnormality. 2. 2 mm hyperdense focus at midline at the level of the foramina of [place], which may reflect calcification within choroid plexus, or may reflect a small partly calcified colloid cyst. No evidence of hydrocephalus. 3. Partially empty sella turcica. This finding is commonly incidental, but can be associated with idiopathic intracranial hypertension. 4. 8 mm pineal cyst. 5. Otherwise unremarkable non-contrast CT appearance of the brain.   CTA neck: The common carotid, internal carotid and vertebral arteries are patent within the neck without stenosis.   CTA head: 1. Irregularity of the M2 and more distal middle cerebral artery vessels bilaterally. Most notably, there are multiple sites of moderate/severe stenosis within M2 left MCA vessels, and sites of up to moderate stenosis within M2 right MCA vessels. This may reflect age-advanced atherosclerotic disease. Alternative etiologies (such as a vasculitis) cannot be excluded. 2. No intracranial aneurysm is identified.   MRI cervical spine 01/13/21:  Minimal multilevel disc degeneration, greatest at C6-7.  No significant disc herniation, spinal canal stenosis, or neural foraminal narrowing.  Lab Results  Component Value Date   CHOL 228 (H) 02/04/2021   HDL 47.40 02/04/2021   LDLCALC 168 (H) 02/04/2021   TRIG 64.0 02/04/2021   CHOLHDL 5 02/04/2021    Lab Results  Component Value Date   HGBA1C 5.3 02/27/2020   No results found for: 04/28/2020 Lab Results  Component Value Date   TSH 2.67 01/16/2021  No results found for: ESRSEDRATE, POCTSEDRATE  Past Medical History:  Diagnosis Date   Arthritis    Chickenpox    Hyperlipidemia    Hypertension     Past Surgical History:  Procedure Laterality Date   KNEE SURGERY  2002     Medications:  Outpatient Encounter Medications as of 03/06/2021  Medication Sig   Cholecalciferol (VITAMIN D3)  1.25 MG (50000 UT) CAPS Take 1 weekly for 12 weeks   gabapentin (NEURONTIN) 300 MG capsule Take 1 capsule (300 mg total) by mouth 3 (three) times daily. (Patient taking differently: Take 300 mg by mouth 2 (two) times daily. Patient takes BID)   Insulin Pen Needle (PEN NEEDLES) 32G X 4 MM MISC Use to inject medication daily   Liraglutide -Weight Management (SAXENDA) 18 MG/3ML SOPN Inject 0.6 mg daily.  Increase by 0.6 mg weekly to goal dose of 3 mg (Patient taking differently: Inject 0.6 mg daily.  Increase by 0.6 mg weekly to goal dose of 3 mg)   lisinopril (ZESTRIL) 20 MG tablet Take 1 tablet (20 mg total) by mouth daily.   rosuvastatin (CRESTOR) 20 MG tablet Take 1 tablet (20 mg total) by mouth daily.   celecoxib (CELEBREX) 200 MG capsule Take 1 capsule (200 mg total) by mouth daily. (Patient not taking: Reported on 03/06/2021)   [DISCONTINUED] predniSONE (DELTASONE) 10 MG tablet 50 mg daily x 2 days, then 40 mg daily x 2 days, then 30 mg daily x 2 days, then 20 mg daily x 2 days, then 10 mg daily x 2 days.   No facility-administered encounter medications on file as of 03/06/2021.    Allergies:  Allergies  Allergen Reactions   Other Hives   Penicillins Hives   Latex Rash    Family History: Family History  Problem Relation Age of Onset   Arthritis Mother    Hyperlipidemia Mother    Hypertension Mother    Cancer Father    COPD Father    Depression Father    Drug abuse Father    Early death Father    Hearing loss Father    Stroke Father    Alcohol abuse Father    Deep vein thrombosis Father    Alcohol abuse Brother    Asthma Brother    Drug abuse Brother    Early death Brother    Alcohol abuse Maternal Grandmother    Hearing loss Maternal Grandmother    Miscarriages / Stillbirths Maternal Grandmother    Heart attack Maternal Grandfather    Hyperlipidemia Maternal Grandfather    Hypertension Maternal Grandfather    Alcohol abuse Paternal Grandmother    Depression Paternal  Grandmother    Drug abuse Paternal Grandmother    Heart attack Paternal Grandmother    Hyperlipidemia Paternal Grandmother    Hypertension Paternal Grandmother    Heart disease Paternal Grandmother    Birth defects Paternal Grandfather     Social History: Social History   Tobacco Use   Smoking status: Never    Passive exposure: Never   Smokeless tobacco: Never  Vaping Use   Vaping Use: Never used  Substance Use Topics   Alcohol use: Yes    Comment: Once a month   Drug use: Never   Social History   Social History Narrative   Right Handed   Lives in a two story home     Vital Signs:  BP 139/83   Pulse 78   Ht 5\' 2"  (1.575 m)   Wt 260 lb (  117.9 kg)   SpO2 97%   BMI 47.55 kg/m   Neurological Exam: MENTAL STATUS including orientation to time, place, person, recent and remote memory, attention span and concentration, language, and fund of knowledge is normal.  Speech is not dysarthric.  CRANIAL NERVES: II:  No visual field defects.  Unremarkable fundi.   III-IV-VI: Pupils equal round and reactive to light.  Normal conjugate, extra-ocular eye movements in all directions of gaze.  No nystagmus.  No ptosis.   V:  Normal facial sensation.    VII:  Normal facial symmetry and movements.   VIII:  Normal hearing and vestibular function.   IX-X:  Normal palatal movement.   XI:  Normal shoulder shrug and head rotation.   XII:  Normal tongue strength and range of motion, no deviation or fasciculation.  MOTOR:  No atrophy, fasciculations or abnormal movements.  No pronator drift.   Upper Extremity:  Right  Left  Deltoid  5/5   5/5   Biceps  5/5   5/5   Triceps  5/5   5/5   Infraspinatus 5/5  5/5  Medial pectoralis 5/5  5/5  Wrist extensors  5/5   5/5   Wrist flexors  5/5   5/5   Finger extensors  5/5   5/5   Finger flexors  5/5   5/5   Dorsal interossei  5/5   5/5   Abductor pollicis  5/5   5/5   Tone (Ashworth scale)  0  0   Lower Extremity:  Right  Left  Hip  flexors  5/5   5/5   Hip extensors  5/5   5/5   Adductor 5/5  5/5  Abductor 5/5  5/5  Knee flexors  5/5   5/5   Knee extensors  5/5   5/5   Dorsiflexors  5/5   5/5   Plantarflexors  5/5   5/5   Toe extensors  5/5   5/5   Toe flexors  5/5   5/5   Tone (Ashworth scale)  0  0   MSRs:  Right        Left                  brachioradialis 2+  2+  biceps 2+  2+  triceps 2+  2+  patellar 2+  2+  ankle jerk 2+  2+  Hoffman no  no  plantar response down  down   SENSORY:  Normal and symmetric perception of light touch, pinprick, vibration, and proprioception.  Romberg's sign absent.   COORDINATION/GAIT: Normal finger-to- nose-finger and heel-to-shin.  Intact rapid alternating movements bilaterally.  Able to rise from a chair without using arms.  Gait narrow based and stable. Tandem and stressed gait intact.    IMPRESSION: Intracranial stenosis with bilateral M2 stenosis, worse on the right.  Management is maximal medical therapy with dual antiplatelet therapy and high dose statin and optimizing risk factors. Given that her MRI cervical spine and NCS/EMG is negative for peripheral cause of right arm paresthesias, I question whether she could be having sensory TIAs  - MRI brain without contrast  - Start aspirin 81mg  + plavix 75mg    - LDL is elevated at 168, she is currently on crestor 20mg  daily.  I will get her PCP's opinion whether this can be increased to 40mg /d  - Continue BP management has per PCP  - Encouraged lifestyle modification  - Refilled gabapentin 300mg  BID  2. Headache associated with  sexual activity  - Start propranolol 20mg  30-60- min prior to intercourse. She may increase the dose to 40mg , if needed  - Avoid triptans with known intracranial stenosis  Return to clinic in 3 months.   Thank you for allowing me to participate in patient's care.  If I can answer any additional questions, I would be pleased to do so.    Sincerely,    Reisha Wos K. , DO

## 2021-03-06 NOTE — Addendum Note (Signed)
Addended by: Abbe Amsterdam C on: 03/06/2021 03:48 PM   Modules accepted: Orders

## 2021-03-26 ENCOUNTER — Ambulatory Visit
Admission: RE | Admit: 2021-03-26 | Discharge: 2021-03-26 | Disposition: A | Discharge status: Home / Self Care | Payer: BLUE CROSS/BLUE SHIELD | Source: Ambulatory Visit | Attending: Neurology | Admitting: Neurology

## 2021-03-26 DIAGNOSIS — G4482 Headache associated with sexual activity: Secondary | ICD-10-CM

## 2021-03-26 DIAGNOSIS — I679 Cerebrovascular disease, unspecified: Secondary | ICD-10-CM

## 2021-03-26 DIAGNOSIS — R519 Headache, unspecified: Secondary | ICD-10-CM | POA: Diagnosis not present

## 2021-03-30 DIAGNOSIS — L500 Allergic urticaria: Secondary | ICD-10-CM | POA: Diagnosis not present

## 2021-04-01 ENCOUNTER — Ambulatory Visit: Payer: BLUE CROSS/BLUE SHIELD | Admitting: Family Medicine

## 2021-04-01 DIAGNOSIS — L509 Urticaria, unspecified: Secondary | ICD-10-CM | POA: Diagnosis not present

## 2021-04-01 DIAGNOSIS — E785 Hyperlipidemia, unspecified: Secondary | ICD-10-CM | POA: Diagnosis not present

## 2021-04-01 DIAGNOSIS — L500 Allergic urticaria: Secondary | ICD-10-CM | POA: Diagnosis not present

## 2021-04-01 DIAGNOSIS — I10 Essential (primary) hypertension: Secondary | ICD-10-CM | POA: Diagnosis not present

## 2021-04-01 DIAGNOSIS — K130 Diseases of lips: Secondary | ICD-10-CM | POA: Diagnosis not present

## 2021-04-01 DIAGNOSIS — T7840XA Allergy, unspecified, initial encounter: Secondary | ICD-10-CM | POA: Diagnosis not present

## 2021-04-01 DIAGNOSIS — L299 Pruritus, unspecified: Secondary | ICD-10-CM | POA: Diagnosis not present

## 2021-04-11 ENCOUNTER — Other Ambulatory Visit: Payer: Self-pay

## 2021-04-11 ENCOUNTER — Ambulatory Visit: Payer: BLUE CROSS/BLUE SHIELD | Admitting: Allergy

## 2021-04-11 ENCOUNTER — Encounter: Payer: Self-pay | Admitting: Allergy

## 2021-04-11 VITALS — BP 124/76 | HR 89 | Ht 62.03 in | Wt 264.0 lb

## 2021-04-11 DIAGNOSIS — T783XXD Angioneurotic edema, subsequent encounter: Secondary | ICD-10-CM | POA: Diagnosis not present

## 2021-04-11 DIAGNOSIS — T783XXA Angioneurotic edema, initial encounter: Secondary | ICD-10-CM | POA: Insufficient documentation

## 2021-04-11 DIAGNOSIS — L509 Urticaria, unspecified: Secondary | ICD-10-CM | POA: Diagnosis not present

## 2021-04-11 MED ORDER — FAMOTIDINE 20 MG PO TABS: 20.0000 mg | ORAL_TABLET | Freq: Two times a day (BID) | ORAL | 2 refills | Status: DC

## 2021-04-11 NOTE — Progress Notes (Signed)
New Patient Note  RE: Janet Owens MRN: [id number] DOB: [birth year] Date of Office Visit: 04/11/2021  Consult requested by: No ref. provider found Primary care provider: Copland, Gay Filler, MD  Chief Complaint: Allergic Reaction (November 4th swelling in hands from itching, urgent care visit for swelling in face. Prednisone given, Penicillin allergy)  History of Present Illness: I had the pleasure of seeing Janet Owens for initial evaluation at the [facility] on 04/11/2021. She is a 45 y.o. female, who is self-referred here for the evaluation of allergic reaction.  Patient had an "allergic reaction" on November 4th.   Patient wakes up early and around 4AM when she woke up she noted that she had itching of her palms and soles of her feet. She took a shower and then she went to pick up her children from her ex-husband.  While she was starting to get ready for work, she noted rashes on her neck which then spread to all over her body.   She did an online visit and started prednisone 10mg  BID. The following day she woke up with swollen hands.  She went to urgent care on 11/5 and she was prescribed additional prednisone and hydroxyzine.  She went to the beach and on 11/7 patient woke up with lip swelling, facial swelling.  Patient went to the ER and was given IV steroids, benadryl. Symptoms slowly improved.   Patient developed a cold after this and mother and her children had some type of URI symptoms with vomiting and diarrhea previously.  Negative covid-19 testing for the children and mother.   No ecchymosis upon resolution. Associated symptoms include: dizziness, nauseous, trouble breathing at the ER.  Frequency of episodes: daily until a few days ago.  Suspected triggers are unknown. Denies any fevers, chills, foods, personal care products. She started Plavix in October and increased Crestor in October as well.  Started aspirin in October.    She has  tried the following therapies: antihistamines and steroids with some benefit. Systemic steroids yes.  Patient has been on lisinopril for many years.  Previous work up includes: some bloodwork at the ER - labwork not available for review during [place]. Previous history of rash/hives: welts as a child after taking penicillin. Patient is up to date with the following cancer screening tests: physical exam, mammogram is due  Reviewed images on the phone - lip swelling noted, some urticarial rash  Assessment and Plan: Iasia is a 45 y.o. female with: Urticaria Patient noted itchy soles/palms on 11/14 morning which then developed into whole body rash. The following day had swollen hands and later on developed lip and facial angioedema. Treated in the ER with IV steroids and benadryl. Question of infection during this time, she also had changes in her medications (new Plavix and aspirin, increased Crestor). Patient has been on lisinopril for many years. Hives as a child after taking penicillin.  Based on clinical history not sure what caused the hives/swelling.  Some concern if she was having some type of infections that may have triggered this. Less likely the new meds as she is still taking them and currently asymptomatic.  Start zyrtec (cetirizine) 10mg  twice a day. If symptoms are not controlled or causes drowsiness let us know. Start pepcid (famotidine) 20mg  twice a day.  Avoid the following potential triggers: alcohol, tight clothing, NSAIDs, hot showers and getting overheated. Get bloodwork to rule out other etiologies.   If no rash/itching/hives for 2 weeks then: Decrease Pepcid  to 20mg  once a day. Continue with zyrtec 10mg  twice a day. If no hives/itching for 2 weeks then: Decrease zyrtec to 10mg  once a day. Continue Pepcid 20mg  once a day. If you get hives then go back to the dose where you didn't have any symptoms.  Angio-edema Sometimes angioedema can occur with urticaria. Patient has  been on lisinopril for many years. Check with PCP regarding changing lisinopril to a non ace-inhibitor type of medication due to recent angioedema episode.   Return in about 2 months (around 06/11/2021).  Meds ordered this encounter  Medications   famotidine (PEPCID) 20 MG tablet    Sig: Take 1 tablet (20 mg total) by mouth 2 (two) times daily.    Dispense:  60 tablet    Refill:  2    Lab Orders         ANA w/Reflex         Alpha-Gal Panel         C1 Esterase Inhibitor         C1 esterase inhibitor, functional         C3 and C4         CBC with Differential/Platelet         Comprehensive metabolic panel         Sedimentation rate         Thyroid Cascade Profile         Tryptase         Chronic Urticaria         C-reactive protein         Complement component c1q      Other allergy screening: Asthma: no Rhino conjunctivitis: no Food allergy: no Medication allergy: yes Hymenoptera allergy: no Eczema:no History of recurrent infections suggestive of immunodeficency: no  Diagnostics: None.  Past Medical History: Patient Active Problem List   Diagnosis Date Noted   Urticaria 04/11/2021   Angio-edema 04/11/2021   Vitamin D deficiency 01/12/2021   Essential hypertension 09/05/2019   Hyperlipidemia 09/05/2019   Class 3 severe obesity without serious comorbidity with body mass index (BMI) of 40.0 to 44.9 in adult [facility]) 09/05/2019   Spondylosis of cervical region without myelopathy or radiculopathy 09/05/2019   Past Medical History:  Diagnosis Date   Angio-edema    Arthritis    Chickenpox    Hyperlipidemia    Hypertension    Urticaria    Past Surgical History: Past Surgical History:  Procedure Laterality Date   KNEE SURGERY  2002   Medication List:  Current Outpatient Medications  Medication Sig Dispense Refill   aspirin EC 81 MG tablet Take 81 mg by mouth daily. Swallow whole.     celecoxib (CELEBREX) 200 MG capsule Take 1 capsule (200 mg total) by mouth  daily. 90 capsule 3   clopidogrel (PLAVIX) 75 MG tablet Take 1 tablet (75 mg total) by mouth daily. 90 tablet 0   famotidine (PEPCID) 20 MG tablet Take 1 tablet (20 mg total) by mouth 2 (two) times daily. 60 tablet 2   gabapentin (NEURONTIN) 300 MG capsule Take 1 capsule (300 mg total) by mouth 2 (two) times daily. 180 capsule 3   Liraglutide -Weight Management (SAXENDA) 18 MG/3ML SOPN Inject 0.6 mg daily.  Increase by 0.6 mg weekly to goal dose of 3 mg (Patient taking differently: Inject 0.6 mg daily.  Increase by 0.6 mg weekly to goal dose of 3 mg) 45 mL 3   lisinopril (ZESTRIL) 20 MG tablet Take 1 tablet (  20 mg total) by mouth daily. 90 tablet 3   propranolol (INDERAL) 20 MG tablet Take 1-2 tablets 30-60 min prior to headache onset 30 tablet 1   rosuvastatin (CRESTOR) 40 MG tablet Take 1 tablet (40 mg total) by mouth daily. 90 tablet 3   No current facility-administered medications for this visit.   Allergies: Allergies  Allergen Reactions   Other Hives   Penicillins Hives   Latex Rash   Social History: Social History   Socioeconomic History   Marital status: Legally Separated    Spouse name: Not on file   Number of children: 2   Years of education: Not on file   Highest education level: Not on file  Occupational History   Not on file  Tobacco Use   Smoking status: Never    Passive exposure: Never   Smokeless tobacco: Never  Vaping Use   Vaping Use: Never used  Substance and Sexual Activity   Alcohol use: Yes    Comment: Once a month   Drug use: Never   Sexual activity: Not on file  Other Topics Concern   Not on file  Social History Narrative   Right Handed   Lives in a two story home    Social Determinants of Health   Financial Resource Strain: Not on file  Food Insecurity: Not on file  Transportation Needs: Not on file  Physical Activity: Not on file  Stress: Not on file  Social Connections: Not on file   Lives in a 43 year old house. Smoking:  denies Occupation: Office manager HistorySurveyor, minerals in the house: no Engineer, civil (consulting) in the family room: no Carpet in the bedroom: yes Heating: gas Cooling: central Pet: yes 2 cats x <1 yr  Family History: Family History  Problem Relation Age of Onset   Urticaria Mother    Allergic rhinitis Mother    Arthritis Mother    Hyperlipidemia Mother    Hypertension Mother    Cancer Father    COPD Father    Depression Father    Drug abuse Father    Early death Father    Hearing loss Father    Stroke Father    Alcohol abuse Father    Deep vein thrombosis Father    Alcohol abuse Brother    Asthma Brother    Drug abuse Brother    Early death Brother    Alcohol abuse Maternal Grandmother    Hearing loss Maternal Grandmother    Miscarriages / Stillbirths Maternal Grandmother    Heart attack Maternal Grandfather    Hyperlipidemia Maternal Grandfather    Hypertension Maternal Grandfather    Alcohol abuse Paternal Grandmother    Depression Paternal Grandmother    Drug abuse Paternal Grandmother    Heart attack Paternal Grandmother    Hyperlipidemia Paternal Grandmother    Hypertension Paternal Grandmother    Heart disease Paternal Grandmother    Birth defects Paternal Grandfather    Eczema Son    Review of Systems  Constitutional:  Positive for chills. Negative for appetite change, fever and unexpected weight change.  HENT:  Positive for sore throat. Negative for congestion and rhinorrhea.   Eyes:  Negative for itching.  Respiratory:  Negative for cough, chest tightness, shortness of breath and wheezing.   Cardiovascular:  Negative for chest pain.  Gastrointestinal:  Positive for nausea. Negative for abdominal pain.  Genitourinary:  Negative for difficulty urinating.  Skin:  Positive for rash.  Neurological:  Negative for headaches.  Objective: BP 124/76 (BP Location: Left Arm, Patient Position: Sitting, Cuff Size: Large)   Pulse 89   Ht 5' 2.03" (1.576  m)   Wt 264 lb (119.7 kg)   SpO2 96%   BMI 48.25 kg/m  Body mass index is 48.25 kg/m. Physical Exam Vitals and nursing note reviewed.  Constitutional:      Appearance: Normal appearance. She is well-developed.  HENT:     Head: Normocephalic and atraumatic.     Right Ear: Tympanic membrane and external ear normal.     Left Ear: Tympanic membrane and external ear normal.     Nose: Nose normal.     Mouth/Throat:     Mouth: Mucous membranes are moist.     Pharynx: Oropharynx is clear.  Eyes:     Conjunctiva/sclera: Conjunctivae normal.  Cardiovascular:     Rate and Rhythm: Normal rate and regular rhythm.     Heart sounds: Normal heart sounds. No murmur heard.   No friction rub. No gallop.  Pulmonary:     Effort: Pulmonary effort is normal.     Breath sounds: Normal breath sounds. No wheezing, rhonchi or rales.  Musculoskeletal:     Cervical back: Neck supple.  Skin:    General: Skin is warm.     Findings: No rash.  Neurological:     Mental Status: She is alert and oriented to person, place, and time.  Psychiatric:        Behavior: Behavior normal.  The plan was reviewed with the patient/family, and all questions/concerned were addressed.  It was my pleasure to see Janet Owens today and participate in her care. Please feel free to contact me with any questions or concerns.  Sincerely,  Rexene Alberts, DO Allergy & Immunology  [facility] office: [place] office: [phone]

## 2021-04-11 NOTE — Patient Instructions (Addendum)
Hives: Not sure what caused your hives/swelling.  Start zyrtec (cetirizine) 10mg  twice a day. If symptoms are not controlled or causes drowsiness let know. Start pepcid (famotidine) 20mg  twice a day.  Avoid the following potential triggers: alcohol, tight clothing, NSAIDs, hot showers and getting overheated. Get bloodwork:  We are ordering labs, so please allow 1-2 weeks for the results to come back. With the newly implemented Cures Act, the labs might be visible to you at the same time that they become visible to me. However, I will not address the results until all of the results are back, so please be patient.    If no rash/itching/hives for 2 weeks then: Decrease Pepcid to 20mg  once a day. Continue with zyrtec 10mg  twice a day. If no hives/itching for 2 weeks then: Decrease zyrtec to 10mg  once a day. Continue Pepcid 20mg  once a day. If you get hives then go back to the dose where you didn't have any symptoms.   Swelling Check with PCP regarding changing lisinopril to a non ace-inhibitor type of medications due to your recent swelling episodes.  Follow up in 2 months or sooner if needed.  Skin care recommendations  Bath time: Always use lukewarm water. AVOID very hot or cold water. Keep bathing time to 5-10 minutes. Do NOT use bubble bath. Use a mild soap and use just enough to wash the dirty areas. Do NOT scrub skin vigorously.  After bathing, pat dry your skin with a towel. Do NOT rub or scrub the skin.  Moisturizers and prescriptions:  ALWAYS apply moisturizers immediately after bathing (within 3 minutes). This helps to lock-in moisture. Use the moisturizer several times a day over the whole body. Good summer moisturizers include: Aveeno, CeraVe, Cetaphil. Good winter moisturizers include: Aquaphor, Vaseline, Cerave, Cetaphil, Eucerin, Vanicream. When using moisturizers along with medications, the moisturizer should be applied about one hour after applying the medication  to prevent diluting effect of the medication or moisturize around where you applied the medications. When not using medications, the moisturizer can be continued twice daily as maintenance.  Laundry and clothing: Avoid laundry products with added color or perfumes. Use unscented hypo-allergenic laundry products such as Tide free, Cheer free & gentle, and All free and clear.  If the skin still seems dry or sensitive, you can try double-rinsing the clothes. Avoid tight or scratchy clothing such as wool. Do not use fabric softeners or dyer sheets.

## 2021-04-11 NOTE — Assessment & Plan Note (Signed)
Sometimes angioedema can occur with urticaria. Patient has been on lisinopril for many years. . Check with PCP regarding changing lisinopril to a non ace-inhibitor type of medication due to recent angioedema episode.

## 2021-04-11 NOTE — Assessment & Plan Note (Signed)
Patient noted itchy soles/palms on 11/14 morning which then developed into whole body rash. The following day had swollen hands and later on developed lip and facial angioedema. Treated in the ER with IV steroids and benadryl. Question of infection during this time, she also had changes in her medications (new Plavix and aspirin, increased Crestor). Patient has been on lisinopril for many years. Hives as a child after taking penicillin.   Based on clinical history not sure what caused the hives/swelling.   Some concern if she was having some type of infections that may have triggered this. Less likely the new meds as she is still taking them and currently asymptomatic.   Start zyrtec (cetirizine) 10mg  twice a day.  If symptoms are not controlled or causes drowsiness let know.  Start pepcid (famotidine) 20mg  twice a day.  . Avoid the following potential triggers: alcohol, tight clothing, NSAIDs, hot showers and getting overheated. . Get bloodwork to rule out other etiologies.   If no rash/itching/hives for 2 weeks then:  Decrease Pepcid to 20mg  once a day. Continue with zyrtec 10mg  twice a day. If no hives/itching for 2 weeks then:  Decrease zyrtec to 10mg  once a day. Continue Pepcid 20mg  once a day.  If you get hives then go back to the dose where you didn't have any symptoms.

## 2021-04-14 NOTE — Progress Notes (Deleted)
[facility] [address] [place], [place] [address] [phone]  Date:  04/17/2021   Name:  Janet Owens   DOB:  September 13, 1975   MRN:  [id number]  PCP:  Pearline Cables, MD    Chief Complaint: No chief complaint on file.   History of Present Illness:  Janet Owens is a 45 y.o. very pleasant female patient who presents with the following:  Virtual visit today for concern of possible sinus infection  Patient Active Problem List   Diagnosis Date Noted   Urticaria 04/11/2021   Angio-edema 04/11/2021   Vitamin D deficiency 01/12/2021   Essential hypertension 09/05/2019   Hyperlipidemia 09/05/2019   Class 3 severe obesity without serious comorbidity with body mass index (BMI) of 40.0 to 44.9 in adult (HCC) 09/05/2019   Spondylosis of cervical region without myelopathy or radiculopathy 09/05/2019    Past Medical History:  Diagnosis Date   Angio-edema    Arthritis    Chickenpox    Hyperlipidemia    Hypertension    Urticaria     Past Surgical History:  Procedure Laterality Date   KNEE SURGERY  2002    Social History   Tobacco Use   Smoking status: Never    Passive exposure: Never   Smokeless tobacco: Never  Vaping Use   Vaping Use: Never used  Substance Use Topics   Alcohol use: Yes    Comment: Once a month   Drug use: Never    Family History  Problem Relation Age of Onset   Urticaria Mother    Allergic rhinitis Mother    Arthritis Mother    Hyperlipidemia Mother    Hypertension Mother    Cancer Father    COPD Father    Depression Father    Drug abuse Father    Early death Father    Hearing loss Father    Stroke Father    Alcohol abuse Father    Deep vein thrombosis Father    Alcohol abuse Brother    Asthma Brother    Drug abuse Brother    Early death Brother    Alcohol abuse Maternal Grandmother    Hearing loss Maternal Grandmother    Miscarriages / Stillbirths Maternal  Grandmother    Heart attack Maternal Grandfather    Hyperlipidemia Maternal Grandfather    Hypertension Maternal Grandfather    Alcohol abuse Paternal Grandmother    Depression Paternal Grandmother    Drug abuse Paternal Grandmother    Heart attack Paternal Grandmother    Hyperlipidemia Paternal Grandmother    Hypertension Paternal Grandmother    Heart disease Paternal Grandmother    Birth defects Paternal Grandfather    Eczema Son     Allergies  Allergen Reactions   Other Hives   Penicillins Hives   Latex Rash    Medication list has been reviewed and updated.  Current Outpatient Medications on File Prior to Visit  Medication Sig Dispense Refill   aspirin EC 81 MG tablet Take 81 mg by mouth daily. Swallow whole.     celecoxib (CELEBREX) 200 MG capsule Take 1 capsule (200 mg total) by mouth daily. 90 capsule 3   clopidogrel (PLAVIX) 75 MG tablet Take 1 tablet (75 mg total) by mouth daily. 90 tablet 0   famotidine (PEPCID) 20 MG tablet Take 1 tablet (20 mg total) by mouth 2 (two) times daily. 60 tablet 2   gabapentin (NEURONTIN) 300 MG capsule Take  1 capsule (300 mg total) by mouth 2 (two) times daily. 180 capsule 3   Liraglutide -Weight Management (SAXENDA) 18 MG/3ML SOPN Inject 0.6 mg daily.  Increase by 0.6 mg weekly to goal dose of 3 mg (Patient taking differently: Inject 0.6 mg daily.  Increase by 0.6 mg weekly to goal dose of 3 mg) 45 mL 3   lisinopril (ZESTRIL) 20 MG tablet Take 1 tablet (20 mg total) by mouth daily. 90 tablet 3   propranolol (INDERAL) 20 MG tablet Take 1-2 tablets 30-60 min prior to headache onset 30 tablet 1   rosuvastatin (CRESTOR) 40 MG tablet Take 1 tablet (40 mg total) by mouth daily. 90 tablet 3   No current facility-administered medications on file prior to visit.    Review of Systems:  ***  Physical Examination: There were no vitals filed for this visit. There were no vitals filed for this visit. There is no height or weight on file to  calculate BMI. Ideal Body Weight:    ***  Assessment and Plan: ***  Signed Abbe Amsterdam, MD

## 2021-04-15 ENCOUNTER — Ambulatory Visit: Payer: BLUE CROSS/BLUE SHIELD | Admitting: Neurology

## 2021-04-16 ENCOUNTER — Ambulatory Visit: Payer: BLUE CROSS/BLUE SHIELD | Admitting: Allergy

## 2021-04-17 ENCOUNTER — Telehealth: Payer: BLUE CROSS/BLUE SHIELD | Admitting: Family Medicine

## 2021-04-23 ENCOUNTER — Telehealth: Payer: Self-pay

## 2021-04-23 NOTE — Telephone Encounter (Signed)
Your prior authorization for Janet Owens has been approved! MORE INFO For eligible patients, copay assistance may be available. To learn more and be redirected to the [employer] website, click on the "More Info" button to the right. Please also note that you may need to schedule a follow-up visit with your patient prior to the expiration of this prior authorization, as updated patient weight may be required for reauthorization.  Message from plan: CaseId:[id number];Status:Approved;Review Type:Prior Auth;Coverage Start Date:03/24/2021;Coverage End Date:08/21/2021;

## 2021-05-10 ENCOUNTER — Telehealth: Payer: Self-pay | Admitting: Allergy

## 2021-05-10 NOTE — Telephone Encounter (Addendum)
Patient states she is having an allergic reaction and has hives on her hands that are itching. She has not been taking her Pepcid and Zyrtec as recommended but did take Zyrtec earlier this week. She stated she has not had the time to get the blood work done but will do so if the lab order is still valid. When she was in the ER back in November, she had blood work done there and will be requesting to have them faxed over to the office for Dr. Selena Batten.

## 2021-05-10 NOTE — Telephone Encounter (Signed)
Left a message for patient to call the office in regards to this matter. I did leave a detailed message informing patient that per Dr. Elmyra Ricks recommendation start Pepcid 20 mg twice a day and Zyrtec 10 mg twice a day as stated in patient's last after visit summary.

## 2021-05-13 ENCOUNTER — Telehealth: Payer: BLUE CROSS/BLUE SHIELD | Admitting: Family Medicine

## 2021-05-13 NOTE — Telephone Encounter (Signed)
Called and left a voicemail asking for patient to return call to discuss.  °

## 2021-05-17 ENCOUNTER — Other Ambulatory Visit: Payer: Self-pay | Admitting: Neurology

## 2021-05-17 NOTE — Telephone Encounter (Signed)
Enough given until follow up with Dr.Donika Allena Katz

## 2021-05-21 ENCOUNTER — Other Ambulatory Visit: Payer: Self-pay | Admitting: Allergy

## 2021-05-21 DIAGNOSIS — T783XXD Angioneurotic edema, subsequent encounter: Secondary | ICD-10-CM | POA: Diagnosis not present

## 2021-05-23 ENCOUNTER — Ambulatory Visit: Payer: BLUE CROSS/BLUE SHIELD | Admitting: Allergy

## 2021-05-29 DIAGNOSIS — T783XXD Angioneurotic edema, subsequent encounter: Secondary | ICD-10-CM | POA: Diagnosis not present

## 2021-05-29 DIAGNOSIS — L509 Urticaria, unspecified: Secondary | ICD-10-CM | POA: Diagnosis not present

## 2021-05-29 LAB — COMPLEMENT COMPONENT C1Q: Complement C1Q: 15.3 mg/dL (ref 10.3–20.5)

## 2021-06-06 ENCOUNTER — Ambulatory Visit (INDEPENDENT_AMBULATORY_CARE_PROVIDER_SITE_OTHER): Payer: BLUE CROSS/BLUE SHIELD | Admitting: Family Medicine

## 2021-06-06 ENCOUNTER — Telehealth: Payer: Self-pay

## 2021-06-06 ENCOUNTER — Encounter: Payer: Self-pay | Admitting: Family Medicine

## 2021-06-06 ENCOUNTER — Other Ambulatory Visit: Payer: Self-pay

## 2021-06-06 VITALS — BP 150/84 | HR 76 | Temp 98.1°F | Resp 16

## 2021-06-06 DIAGNOSIS — L509 Urticaria, unspecified: Secondary | ICD-10-CM

## 2021-06-06 DIAGNOSIS — T783XXD Angioneurotic edema, subsequent encounter: Secondary | ICD-10-CM

## 2021-06-06 DIAGNOSIS — J302 Other seasonal allergic rhinitis: Secondary | ICD-10-CM

## 2021-06-06 DIAGNOSIS — J3089 Other allergic rhinitis: Secondary | ICD-10-CM

## 2021-06-06 MED ORDER — EPINEPHRINE 0.3 MG/0.3ML IJ SOAJ: 0.3000 mg | Freq: Once | INTRAMUSCULAR | 1 refills | Status: AC

## 2021-06-06 NOTE — Progress Notes (Signed)
[place] [address] Dept: [phone]  FOLLOW UP NOTE  Patient ID: Janet Owens, female    DOB: 1975/08/15  Age: 46 y.o. MRN: [id number] Date of Office Visit: 06/06/2021  Assessment  Chief Complaint: Allergy Testing  HPI Janet Owens is a 46 year old female who presents to the clinic for follow-up visit.  She was last seen in this clinic on 04/11/2020 by Dr. Maudie Mercury for evaluation of urticaria and angioedema.  She reports an episode of hives beginning on November 4 which progressed into hand and lip swelling for which she received a steroid and high-dose antihistamines. She reports these symptoms worsened over the next 2 days at which time she went to the emergency department for further evaluation and received IV medications with relief of symptoms in about 6 hours. At the time of resolution, she stopped high-dose antihistamines.  She reports a second incident occurring on December 26 with hives and itch occurring on bilateral hands and arms as well as shortness of breath with nausea and possible throat tightening.  At that time, she began cetirizine and famotidine with resolution of symptoms.  She does report a viral illness circulating throughout her family around the time of the first reaction.  She reports the suspected triggers are water chestnuts and possibly an eye shadow.  She is not currently avoiding any foods.  She has not taken antihistamines for the last 3 days. She continues lisinopril daily.  Of note, we reviewed the lab work results from the labs that were available from her last visit, however, not all the labs had resulted at that time, specifically the chronic urticaria panel. The ANA direct was abnormal and the reflex lab was positive to RNP antibodies. All other labs, including CBC with differential, CMP, alpha gal, C4 + C3, tryptase, C1 esterase functional inhibitor, C1 esterase inhibitor serum, complement C1q quantitative, thyroid cascade, sedimentation  rate, and C-Reactive protein were within normal range.    Drug Allergies:  Allergies  Allergen Reactions   Other Hives   Penicillins Hives    Physical Exam: BP (!) 150/84    Pulse 76    Temp 98.1 F (36.7 C) (Temporal)    Resp 16    SpO2 98%    Physical Exam Vitals reviewed.  Constitutional:      Appearance: Normal appearance.  HENT:     Head: Normocephalic and atraumatic.     Right Ear: Tympanic membrane normal.     Left Ear: Tympanic membrane normal.     Nose:     Comments: Bilateral nares normal.  Pharynx normal.  Ears normal.  Eyes normal.    Mouth/Throat:     Pharynx: Oropharynx is clear.  Eyes:     Conjunctiva/sclera: Conjunctivae normal.  Cardiovascular:     Rate and Rhythm: Normal rate and regular rhythm.     Heart sounds: Normal heart sounds. No murmur heard. Pulmonary:     Effort: Pulmonary effort is normal.     Breath sounds: Normal breath sounds.     Comments: Lungs clear to auscultation Musculoskeletal:        General: Normal range of motion.     Cervical back: Normal range of motion and neck supple.  Skin:    General: Skin is warm and dry.  Neurological:     Mental Status: She is alert and oriented to person, place, and time.  Psychiatric:        Mood and Affect: Mood normal.  Behavior: Behavior normal.        Thought Content: Thought content normal.        Judgment: Judgment normal.    Diagnostics: Percutaneous environmental skin testing was positive to grass pollen and dust mite with adequate controls  Intradermal environmental skin testing was negative with adequate control test  Percutaneous food allergy testing was negative to the adult food panel with adequate controls  Assessment and Plan: 1. Urticaria   2. Angioedema, subsequent encounter   3. Seasonal and perennial allergic rhinitis     Meds ordered this encounter  Medications   EPINEPHrine (AUVI-Q) 0.3 mg/0.3 mL IJ SOAJ injection    Sig: Inject 0.3 mg into the muscle once  for 1 dose. As directed for life-threatening allergic reactions    Dispense:  1 each    Refill:  2    Patient Instructions  Allergic rhinitis Yearly skin testing was positive to grass pollen and dust mites. Allergen avoidance measures are listed below Continue cetirizine 10 mg once a day as needed for runny nose or itch You may use Flonase 1 to 2 sprays in each nostril once a day as needed for stuffy nose Consider saline nasal rinses as needed for nasal symptoms. Use this before any medicated nasal sprays for best result  Hives (urticaria)  Cetirizine (Zyrtec) 10mg  twice a day and famotidine (Pepcid) 20 mg twice a day. If no symptoms for 7-14 days then decrease to Cetirizine (Zyrtec) 10mg  twice a day and famotidine (Pepcid) 20 mg once a day.  If no symptoms for 7-14 days then decrease to Cetirizine (Zyrtec) 10mg  twice a day.  If no symptoms for 7-14 days then decrease to Cetirizine (Zyrtec) 10mg  once a day.  May use Benadryl (diphenhydramine) as needed for breakthrough hives       If symptoms return, then step up dosage  Keep a detailed symptom journal including foods eaten, contact with allergens, medications taken, weather changes.  We will call you when the lab results become available  Food allergy Your skin testing was negative at today's visit.If your symptoms re-occur, begin a journal of events that occurred for up to 6 hours before your symptoms began including foods and beverages consumed, soaps or perfumes you had contact with, and medications.   Elevated ANA Please refer to rheumatology for evaluation of elevated RNP antibodies  Call the clinic if this treatment plan is not working well for you.  Follow up in 1 month or sooner if needed.   Return in about 4 weeks (around 07/04/2021), or if symptoms worsen or fail to improve.    Thank you for the opportunity to care for this patient.  Please do not hesitate to contact me with questions.  Gareth Morgan, FNP [facility]

## 2021-06-06 NOTE — Progress Notes (Signed)
Follow-up Visit   Date: 06/07/21   Janet Owens MRN: [id number] DOB: April 03, 1976   Interim History: Janet Owens is a 46 y.o. right-handed female with hypertension and hyperlipidemia returning to the clinic for follow-up of headaches and intracranial stenosis.  The patient was accompanied to the clinic by self.  History of present illness: Starting in February 2022, she began having spells of right arm numbness which would wake her up from sleeping.  She tried various exercises to get relief.  It generally lasted about 10-30 minutes. In August, she began having worsening symptoms occurring every time she was trying to lay down or even with standing.  She was started on gabapentin 300mg  twice daily which has helped.  She still wakes up with her arm numb, but she is able to rest and go to sleep.    Also in August, she began having headaches with intercourse.  She complains of "thunderclap headache".  Pain is left sided headache, described as stabbing pain and lasts 30-minutes.  She complains of headaches almost daily headache.  She takes tylenol for headache.   She denies right face or leg parethesia.  No left sided symptoms.  Father has stroke at the age of 11.   She underwent MRI cervical spine in August which did not show any nerve impingement.  NCS/EMG of the right arm showed mild CTS.  CTA head was performed due to headaches and shows bilateral M2 MCA stenosis, worse on the left, as well as calcified coilloid cyst and pineal cyst.  She was evaluated by neurosurgery who recommended repeat imaging of the cyst in 6 months and follow-up here.   UPDATE 06/07/2021:  She is here for follow-up.  She has been compliant with plavix 75mg , aspirin 17m, and Crestor 40mg .  She continues to have nightly spells of right arm numbness, lasting 10-min.   She has reduced gabapentin to 300mg  at bedtime only.  She been intimidate with anyone so has not taken propranolol for headaches.   She reports  having allergic reaction to something, however, allergy testing was negative. She will be seeing rheumatology for further evaluation.  She is on Zyrtec.  She has many questions about her generalized health and plans for bariatric surgery which were addressed.   Medications:  Current Outpatient Medications on File Prior to Visit  Medication Sig Dispense Refill   aspirin EC 81 MG tablet Take 81 mg by mouth daily. Swallow whole.     cetirizine (ZYRTEC) 10 MG tablet Take 10 mg by mouth daily. Prn     clopidogrel (PLAVIX) 75 MG tablet TAKE 1 TABLET DAILY 22 tablet 0   famotidine (PEPCID) 20 MG tablet Take 1 tablet (20 mg total) by mouth 2 (two) times daily. (Patient taking differently: Take 20 mg by mouth 2 (two) times daily. prn) 60 tablet 2   gabapentin (NEURONTIN) 300 MG capsule Take 1 capsule (300 mg total) by mouth 2 (two) times daily. 180 capsule 3   lisinopril (ZESTRIL) 20 MG tablet Take 1 tablet (20 mg total) by mouth daily. 90 tablet 3   rosuvastatin (CRESTOR) 40 MG tablet Take 1 tablet (40 mg total) by mouth daily. 90 tablet 3   celecoxib (CELEBREX) 200 MG capsule Take 1 capsule (200 mg total) by mouth daily. (Patient not taking: Reported on 06/07/2021) 90 capsule 3   No current facility-administered medications on file prior to visit.    Allergies:  Allergies  Allergen Reactions   Other Hives   Penicillins Hives  Vital Signs:  BP (!) 147/83    Pulse 83    Ht 5' 2.3" (1.582 m)    Wt 269 lb (122 kg)    SpO2 97%    BMI 48.73 kg/m   Neurological Exam: MENTAL STATUS including orientation to time, place, person, recent and remote memory, attention span and concentration, language, and fund of knowledge is normal.  Speech is not dysarthric.  CRANIAL NERVES:  No visual field defects.  Pupils equal round and reactive to light.  Normal conjugate, extra-ocular eye movements in all directions of gaze.  No ptosis.  Face is symmetric. Palate elevates symmetrically.  Tongue is  midline.  MOTOR:  Motor strength is 5/5 in all extremities.  No atrophy, fasciculations or abnormal movements.  No pronator drift.  Tone is normal.    MSRs:  Reflexes are 2+/4 throughout.  SENSORY:  Intact to vibration throughout.  COORDINATION/GAIT:  Normal finger-to- nose-finger.  Intact rapid alternating movements bilaterally.  Gait narrow based and stable.   Data: CTA head and neck 02/08/2021: 1. No evidence of acute intracranial abnormality. 2. 2 mm hyperdense focus at midline at the level of the foramina of [place], which may reflect calcification within choroid plexus, or may reflect a small partly calcified colloid cyst. No evidence of hydrocephalus. 3. Partially empty sella turcica. This finding is commonly incidental, but can be associated with idiopathic intracranial hypertension. 4. 8 mm pineal cyst. 5. Otherwise unremarkable non-contrast CT appearance of the brain.   CTA neck: The common carotid, internal carotid and vertebral arteries are patent within the neck without stenosis.   CTA head: 1. Irregularity of the M2 and more distal middle cerebral artery vessels bilaterally. Most notably, there are multiple sites of moderate/severe stenosis within M2 left MCA vessels, and sites of up to moderate stenosis within M2 right MCA vessels. This may reflect age-advanced atherosclerotic disease. Alternative etiologies (such as a vasculitis) cannot be excluded. 2. No intracranial aneurysm is identified.  MRI brain wo contrast 03/26/2021:  Unchanged 7 mm pineal cyst, otherwise unremarkable appearance of the brain. No acute intracranial abnormality.   NCS/EMG of the right arm 02/05/2021:  The above electrodiagnostic study is ABNORMAL and reveals evidence of a mild to moderate right median nerve entrapment at the wrist (carpal tunnel syndrome) affecting sensory components.  This would not necessarily explain the totality of her symptoms.  IMPRESSION/PLAN: Right arm paresthesias,  contributed by carpal tunnel syndrome - Start wearing wrist splint - Continue gabapentin 300mg  at bedtime  2. Intracranial stenosis with bilateral M2 stenosis, worse on the right. Asymptomatic. Initially I was concerned that she was having sensory TIA causing right arm paresthesia, but I would expect this to evolve over time and think paresthesias are most likely due to entrapment neuropathy. She has been on maximal medication therapy for almost three months.  MRI brain did not show acute stroke.  She has known 75mm pineal cyst.   - Finish two more weeks of dual antiplatelet therapy with aspirin + plavix, then stop plavix and continue aspirin 81mg     - Check lipid panel (LDL is elevated at 168) - Continue Crestor 40mg  daily  - BP management as per PCP  3. Headache associated with sexual activity  - OK to use propranolol 20mg  30-60- min prior to intercourse as needed  - Avoid triptans with known intracranial stenosis  Return to clinic in 9 months.   Thank you for allowing me to participate in patient's care.  If I can answer any additional  questions, I would be pleased to do so.    Sincerely,    Shadoe Bethel K. Posey Pronto, DO

## 2021-06-06 NOTE — Telephone Encounter (Signed)
can you please call Janet Owens and ask her if she is still taking lisinopril or if she has gotten that changed to a different medication? Also inform her of sending in a epipen and verify the pharmacy   Lm for pt to call us back

## 2021-06-06 NOTE — Telephone Encounter (Signed)
Pt is still on lisinopril  [facility]

## 2021-06-06 NOTE — Patient Instructions (Addendum)
Reaction In case of an allergic reaction, take Benadryl 50 mg every 4 hours, and if life-threatening symptoms occur, inject with AuviQ 0.3 mg. If your symptoms re-occur, begin a journal of events that occurred for up to 6 hours before your symptoms began including foods and beverages consumed, soaps or perfumes you had contact with, and medications.   Allergic rhinitis Your skin testing was positive to grass pollen and dust mites. Allergen avoidance measures are listed below Continue cetirizine 10 mg once a day as needed for runny nose or itch You may use Flonase 1 to 2 sprays in each nostril once a day as needed for stuffy nose Consider saline nasal rinses as needed for nasal symptoms. Use this before any medicated nasal sprays for best result  Hives (urticaria) Use the least amount of medications while remaining hive free Cetirizine (Zyrtec) 10mg  twice a day and famotidine (Pepcid) 20 mg twice a day. If no symptoms for 7-14 days then decrease to Cetirizine (Zyrtec) 10mg  twice a day and famotidine (Pepcid) 20 mg once a day.  If no symptoms for 7-14 days then decrease to Cetirizine (Zyrtec) 10mg  twice a day.  If no symptoms for 7-14 days then decrease to Cetirizine (Zyrtec) 10mg  once a day.  May use Benadryl (diphenhydramine) as needed for breakthrough hives       If symptoms return, then step up dosage Keep a detailed symptom journal including foods eaten, contact with allergens, medications taken, weather changes.  We will call you when the lab results become available You may benefit from patch testing which tests for 36 chemicals that are commonly used.  Patches are placed on the Monday and removed on Wednesday.  The first reading occurs on Wednesday and the second reading occurs on Friday.  Call the clinic if you are interested in this option  Food allergy Your skin testing was negative at today's visit.If your symptoms re-occur, begin a journal of events that occurred for up to 6  hours before your symptoms began including foods and beverages consumed, soaps or perfumes you had contact with, and medications.   Elevated ANA Please refer to rheumatology for evaluation of elevated RNP antibodies  Call the clinic if this treatment plan is not working well for you.  Follow up in 1 month or sooner if needed.  Reducing Pollen Exposure The American Academy of Allergy, Asthma and Immunology suggests the following steps to reduce your exposure to pollen during allergy seasons. Do not hang sheets or clothing out to dry; pollen may collect on these items. Do not mow lawns or spend time around freshly cut grass; mowing stirs up pollen. Keep windows closed at night.  Keep car windows closed while driving. Minimize morning activities outdoors, a time when pollen counts are usually at their highest. Stay indoors as much as possible when pollen counts or humidity is high and on windy days when pollen tends to remain in the air longer. Use air conditioning when possible.  Many air conditioners have filters that trap the pollen spores. Use a HEPA room air filter to remove pollen form the indoor air you breathe.   Control of Dust Mite Allergen Dust mites play a major role in allergic asthma and rhinitis. They occur in environments with high humidity wherever human skin is found. Dust mites absorb humidity from the atmosphere (ie, they do not drink) and feed on organic matter (including shed human and animal skin). Dust mites are a microscopic type of insect that you cannot see with  the naked eye. High levels of dust mites have been detected from mattresses, pillows, carpets, upholstered furniture, bed covers, clothes, soft toys and any woven material. The principal allergen of the dust mite is found in its feces. A gram of dust may contain 1,000 mites and 250,000 fecal particles. Mite antigen is easily measured in the air during house cleaning activities. Dust mites do not bite and do not  cause harm to humans, other than by triggering allergies/asthma.  Ways to decrease your exposure to dust mites in your home:  1. Encase mattresses, box springs and pillows with a mite-impermeable barrier or cover  2. Wash sheets, blankets and drapes weekly in hot water (130 F) with detergent and dry them in a dryer on the hot setting.  3. Have the room cleaned frequently with a vacuum cleaner and a damp dust-mop. For carpeting or rugs, vacuuming with a vacuum cleaner equipped with a high-efficiency particulate air (HEPA) filter. The dust mite allergic individual should not be in a room which is being cleaned and should wait 1 hour after cleaning before going into the room.  4. Do not sleep on upholstered furniture (eg, couches).  5. If possible removing carpeting, upholstered furniture and drapery from the home is ideal. Horizontal blinds should be eliminated in the rooms where the person spends the most time (bedroom, study, television room). Washable vinyl, roller-type shades are optimal.  6. Remove all non-washable stuffed toys from the bedroom. Wash stuffed toys weekly like sheets and blankets above.  7. Reduce indoor humidity to less than 50%. Inexpensive humidity monitors can be purchased at most hardware stores. Do not use a humidifier as can make the problem worse and are not recommended.

## 2021-06-07 ENCOUNTER — Ambulatory Visit: Payer: BLUE CROSS/BLUE SHIELD | Admitting: Neurology

## 2021-06-07 ENCOUNTER — Encounter: Payer: Self-pay | Admitting: Neurology

## 2021-06-07 VITALS — BP 147/83 | HR 83 | Ht 62.3 in | Wt 269.0 lb

## 2021-06-07 DIAGNOSIS — G5601 Carpal tunnel syndrome, right upper limb: Secondary | ICD-10-CM

## 2021-06-07 DIAGNOSIS — I679 Cerebrovascular disease, unspecified: Secondary | ICD-10-CM | POA: Diagnosis not present

## 2021-06-07 DIAGNOSIS — E782 Mixed hyperlipidemia: Secondary | ICD-10-CM | POA: Diagnosis not present

## 2021-06-07 LAB — COMPREHENSIVE METABOLIC PANEL
ALT: 20 IU/L (ref 0–32)
AST: 19 IU/L (ref 0–40)
Albumin/Globulin Ratio: 1.7 (ref 1.2–2.2)
Albumin: 4.7 g/dL (ref 3.8–4.8)
Alkaline Phosphatase: 60 IU/L (ref 44–121)
BUN/Creatinine Ratio: 15 (ref 9–23)
BUN: 11 mg/dL (ref 6–24)
Bilirubin Total: 0.2 mg/dL (ref 0.0–1.2)
CO2: 23 mmol/L (ref 20–29)
Calcium: 9.8 mg/dL (ref 8.7–10.2)
Chloride: 102 mmol/L (ref 96–106)
Creatinine, Ser: 0.75 mg/dL (ref 0.57–1.00)
Globulin, Total: 2.7 g/dL (ref 1.5–4.5)
Glucose: 96 mg/dL (ref 70–99)
Potassium: 4.4 mmol/L (ref 3.5–5.2)
Sodium: 137 mmol/L (ref 134–144)
Total Protein: 7.4 g/dL (ref 6.0–8.5)
eGFR: 101 mL/min/{1.73_m2} (ref >59)

## 2021-06-07 LAB — ALPHA-GAL PANEL
Allergen Lamb IgE: <0.1 kU/L
Beef IgE: <0.1 kU/L
IgE (Immunoglobulin E), Serum: 13 IU/mL (ref 6–495)
O215-IgE Alpha-Gal: <0.1 kU/L
Pork IgE: <0.1 kU/L

## 2021-06-07 LAB — CBC WITH DIFFERENTIAL/PLATELET
Basophils Absolute: 0 10*3/uL (ref 0.0–0.2)
Basos: 0 %
EOS (ABSOLUTE): 0.1 10*3/uL (ref 0.0–0.4)
Eos: 2 %
Hematocrit: 41.4 % (ref 34.0–46.6)
Hemoglobin: 14 g/dL (ref 11.1–15.9)
Immature Grans (Abs): 0 10*3/uL (ref 0.0–0.1)
Immature Granulocytes: 0 %
Lymphocytes Absolute: 2.5 10*3/uL (ref 0.7–3.1)
Lymphs: 34 %
MCH: 29.3 pg (ref 26.6–33.0)
MCHC: 33.8 g/dL (ref 31.5–35.7)
MCV: 87 fL (ref 79–97)
Monocytes Absolute: 0.6 10*3/uL (ref 0.1–0.9)
Monocytes: 8 %
Neutrophils Absolute: 4 10*3/uL (ref 1.4–7.0)
Neutrophils: 56 %
Platelets: 381 10*3/uL (ref 150–450)
RBC: 4.78 x10E6/uL (ref 3.77–5.28)
RDW: 11.8 % (ref 11.7–15.4)
WBC: 7.2 10*3/uL (ref 3.4–10.8)

## 2021-06-07 LAB — CHRONIC URTICARIA: cu index: 7 (ref <10)

## 2021-06-07 LAB — SEDIMENTATION RATE: Sed Rate: 15 mm/hr (ref 0–32)

## 2021-06-07 LAB — C-REACTIVE PROTEIN: CRP: 1 mg/L (ref 0–10)

## 2021-06-07 LAB — ANA W/REFLEX: Anti Nuclear Antibody (ANA): POSITIVE — AB

## 2021-06-07 LAB — ENA+DNA/DS+SJORGEN'S
ENA RNP Ab: 1.5 AI — ABNORMAL HIGH (ref 0.0–0.9)
ENA SM Ab Ser-aCnc: <0.2 AI (ref 0.0–0.9)
ENA SSA (RO) Ab: <0.2 AI (ref 0.0–0.9)
ENA SSB (LA) Ab: <0.2 AI (ref 0.0–0.9)
dsDNA Ab: 2 IU/mL (ref 0–9)

## 2021-06-07 LAB — C3 AND C4
Complement C3, Serum: 143 mg/dL (ref 82–167)
Complement C4, Serum: 17 mg/dL (ref 12–38)

## 2021-06-07 LAB — TRYPTASE: Tryptase: 6.4 ug/L (ref 2.2–13.2)

## 2021-06-07 LAB — C1 ESTERASE INHIBITOR, FUNCTIONAL: C1INH Functional/C1INH Total MFr SerPl: 72 %mean normal

## 2021-06-07 LAB — C1 ESTERASE INHIBITOR: C1INH SerPl-mCnc: 24 mg/dL (ref 21–39)

## 2021-06-07 LAB — THYROID CASCADE PROFILE: TSH: 1.59 u[IU]/mL (ref 0.450–4.500)

## 2021-06-07 NOTE — Patient Instructions (Addendum)
Start using wrist brace at bedtime   Check lipid fasting panel  Complete plavix prescription, then stop. Continue aspirin 81mg  daily.  Continue crestor  Return to clinic in 9 months

## 2021-06-09 ENCOUNTER — Encounter: Payer: Self-pay | Admitting: Family Medicine

## 2021-06-09 MED ORDER — EPINEPHRINE 0.3 MG/0.3ML IJ SOAJ: 0.3000 mg | Freq: Once | INTRAMUSCULAR | 2 refills | Status: AC

## 2021-06-10 ENCOUNTER — Telehealth: Payer: Self-pay

## 2021-06-10 NOTE — Telephone Encounter (Signed)
Pt has not spoke to pcp about lisionopril but will email pcp about this

## 2021-06-10 NOTE — Telephone Encounter (Signed)
Can you please have this patient ask her PCP about changing from lisinopril to an ARB. Lisinopril can cause some intermittent swelling in some patients. Thank you

## 2021-06-10 NOTE — Telephone Encounter (Signed)
Please place referral to rheum for positive ANA and elevated RNP per Dr Maudie Mercury Thank you

## 2021-06-10 NOTE — Telephone Encounter (Signed)
Lm for pt to call us back  

## 2021-06-11 ENCOUNTER — Telehealth: Payer: Self-pay | Admitting: Family Medicine

## 2021-06-11 ENCOUNTER — Other Ambulatory Visit: Payer: Self-pay | Admitting: Neurology

## 2021-06-11 DIAGNOSIS — I679 Cerebrovascular disease, unspecified: Secondary | ICD-10-CM | POA: Diagnosis not present

## 2021-06-11 DIAGNOSIS — G5601 Carpal tunnel syndrome, right upper limb: Secondary | ICD-10-CM | POA: Diagnosis not present

## 2021-06-11 NOTE — Telephone Encounter (Signed)
Pt stated [facility] will be faxing papers to be filled out for fmla. Pt would like to be contacted once completed to pick up to fill out her portion of the papers.    Pt stated Dr. Maudie Mercury and Allergy and Asthma advised pt to see if she could have an alternate med for lisinopril for hypertension. [phone] any questions. Please advise.

## 2021-06-12 DIAGNOSIS — Z1231 Encounter for screening mammogram for malignant neoplasm of breast: Secondary | ICD-10-CM | POA: Diagnosis not present

## 2021-06-12 LAB — HM MAMMOGRAPHY

## 2021-06-12 LAB — LIPID PANEL
Chol/HDL Ratio: 2.6 ratio (ref 0.0–4.4)
Cholesterol, Total: 116 mg/dL (ref 100–199)
HDL: 44 mg/dL (ref >39)
LDL Chol Calc (NIH): 56 mg/dL (ref 0–99)
Triglycerides: 77 mg/dL (ref 0–149)
VLDL Cholesterol Cal: 16 mg/dL (ref 5–40)

## 2021-06-18 ENCOUNTER — Ambulatory Visit: Payer: BLUE CROSS/BLUE SHIELD | Admitting: Allergy

## 2021-06-21 DIAGNOSIS — I10 Essential (primary) hypertension: Secondary | ICD-10-CM | POA: Diagnosis not present

## 2021-06-21 DIAGNOSIS — G4733 Obstructive sleep apnea (adult) (pediatric): Secondary | ICD-10-CM | POA: Insufficient documentation

## 2021-06-21 DIAGNOSIS — E785 Hyperlipidemia, unspecified: Secondary | ICD-10-CM | POA: Diagnosis not present

## 2021-06-24 ENCOUNTER — Other Ambulatory Visit: Payer: Self-pay | Admitting: Family Medicine

## 2021-06-24 DIAGNOSIS — I10 Essential (primary) hypertension: Secondary | ICD-10-CM

## 2021-06-27 DIAGNOSIS — Z713 Dietary counseling and surveillance: Secondary | ICD-10-CM | POA: Diagnosis not present

## 2021-06-27 NOTE — Telephone Encounter (Signed)
Spoke to the pt she wanted to see if she needed an appointment to have an alternate med for lisinopril for her hypertension. She says her Allergists suggested the switch after testing for Urticaria. Pt uses [facility].   Please advise.   Advised pt Forms never arrived, she says she only needed them in case her job required them so she will request another set and send it via [employer] for security.

## 2021-06-28 ENCOUNTER — Encounter: Payer: Self-pay | Admitting: Family Medicine

## 2021-06-28 DIAGNOSIS — I10 Essential (primary) hypertension: Secondary | ICD-10-CM

## 2021-06-28 MED ORDER — AMLODIPINE BESYLATE 5 MG PO TABS: 5.0000 mg | ORAL_TABLET | Freq: Every day | ORAL | 3 refills | Status: DC

## 2021-06-28 NOTE — Telephone Encounter (Signed)
Thank you Dee

## 2021-06-28 NOTE — Telephone Encounter (Signed)
Elma called and states that she has not heard anything about her referral process. Please reach out to her as soon as possible.

## 2021-06-28 NOTE — Telephone Encounter (Signed)
Referral has been placed today. I will send a MyChart message to the patient. This patient wasn't in the in-basket so her message must have been marked as completed.    Thanks [employer]

## 2021-07-08 ENCOUNTER — Ambulatory Visit: Payer: BLUE CROSS/BLUE SHIELD | Admitting: Family Medicine

## 2021-07-11 DIAGNOSIS — Z713 Dietary counseling and surveillance: Secondary | ICD-10-CM | POA: Diagnosis not present

## 2021-07-11 DIAGNOSIS — R635 Abnormal weight gain: Secondary | ICD-10-CM | POA: Diagnosis not present

## 2021-07-15 ENCOUNTER — Other Ambulatory Visit: Payer: Self-pay

## 2021-07-15 DIAGNOSIS — I10 Essential (primary) hypertension: Secondary | ICD-10-CM

## 2021-07-15 MED ORDER — EPINEPHRINE 0.3 MG/0.3ML IJ SOAJ: 0.3000 mg | Freq: Once | INTRAMUSCULAR | 1 refills | Status: AC

## 2021-07-15 MED ORDER — AMLODIPINE BESYLATE 5 MG PO TABS: 5.0000 mg | ORAL_TABLET | Freq: Every day | ORAL | 3 refills | Status: DC

## 2021-07-16 DIAGNOSIS — F4320 Adjustment disorder, unspecified: Secondary | ICD-10-CM | POA: Diagnosis not present

## 2021-08-05 DIAGNOSIS — F4320 Adjustment disorder, unspecified: Secondary | ICD-10-CM | POA: Diagnosis not present

## 2021-08-09 NOTE — Progress Notes (Addendum)
[facility] ?[address], [address] ?[place], [place] [id number] ?336 [phone] ?Fax [phone] ? ?Date:  08/14/2021  ? ?Name:  Janet SawyersStephanie Owens   DOB:  04/10/1976   MRN:  [id number] ? ?PCP:  Janet Owens, Janet FoundJessica C, MD  ? ? ?Chief Complaint: Pre-op Exam (Concerns/ questions: needs labs to screen for DM. Pt is on antibiotics for H Pylori. /) ? ? ?History of Present Illness: ? ?Janet SawyersStephanie Owens is a 46 y.o. very pleasant female patient who presents with the following: ? ?Patient seen today for concern of surgical clearance ?Most recent visit with myself was in September ?History of hypertension, dyslipidemia, obesity ? ?At our last visit she was having headache with sexual activity.  We did an MRI of her head and CTA of her neck, which showed some possible abnormalities.  Referral made to neurology, she saw Dr. Allena KatzPatel in January: ?  ?IMPRESSION/PLAN: ?Right arm paresthesias, contributed by carpal tunnel syndrome ?- Start wearing wrist splint ?- Continue gabapentin 300mg  at bedtime  ?2. Intracranial stenosis with bilateral M2 stenosis, worse on the right. Asymptomatic. Initially I was concerned that she was having sensory TIA causing right arm paresthesia, but I would expect this to evolve over time and think paresthesias are most likely due to entrapment neuropathy. She has been on maximal medication therapy for almost three months.  MRI brain did not show acute stroke.  She has known 7mm pineal cyst.  ?             - Finish two more weeks of dual antiplatelet therapy with aspirin + plavix, then stop plavix and continue aspirin 81mg    ?             - Check lipid panel (LDL is elevated at 168) ?- Continue Crestor 40mg  daily ?             - BP management as per PCP ?3. Headache associated with sexual activity ?             - OK to use propranolol 20mg  30-60- min prior to intercourse as needed ?             - Avoid triptans with known intracranial stenosis ?  ?She is hoping to have gastric  sleeve surgery- her surgery date is not set up yet  ?She is working with [facility] for her surgery ? ?The plan is for an overnight stay at the hospital ?She feels well prepared for her surgery, she is excited take this neck step ? ?Patient does not get a lot of formal exercise, but notes she is able to do activities such as vigorous housecleaning, brisk walking, climbing stairs with groceries with no chest pain or abnormal shortness of breath ? ?Patient Active Problem List  ? Diagnosis Date Noted  ? Urticaria 04/11/2021  ? Angio-edema 04/11/2021  ? Vitamin D deficiency 01/12/2021  ? Essential hypertension 09/05/2019  ? Hyperlipidemia 09/05/2019  ? Class 3 severe obesity without serious comorbidity with body mass index (BMI) of 40.0 to 44.9 in adult [facility](HCC) 09/05/2019  ? Spondylosis of cervical region without myelopathy or radiculopathy 09/05/2019  ? ? ?Past Medical History:  ?Diagnosis Date  ? Angio-edema   ? Arthritis   ? Chickenpox   ? Hyperlipidemia   ? Hypertension   ? Urticaria   ? ? ?Past Surgical History:  ?Procedure Laterality Date  ? KNEE SURGERY  2002  ? ? ?Social History  ? ?Tobacco Use  ?  Smoking status: Never  ?  Passive exposure: Never  ? Smokeless tobacco: Never  ?Vaping Use  ? Vaping Use: Never used  ?Substance Use Topics  ? Alcohol use: Yes  ?  Comment: Once a month  ? Drug use: Never  ? ? ?Family History  ?Problem Relation Age of Onset  ? Urticaria Mother   ? Allergic rhinitis Mother   ? Arthritis Mother   ? Hyperlipidemia Mother   ? Hypertension Mother   ? Cancer Father   ? COPD Father   ? Depression Father   ? Drug abuse Father   ? Early death Father   ? Hearing loss Father   ? Stroke Father   ? Alcohol abuse Father   ? Deep vein thrombosis Father   ? Alcohol abuse Brother   ? Asthma Brother   ? Drug abuse Brother   ? Early death Brother   ? Alcohol abuse Maternal Grandmother   ? Hearing loss Maternal Grandmother   ? Miscarriages / Stillbirths Maternal Grandmother   ? Heart attack Maternal  Grandfather   ? Hyperlipidemia Maternal Grandfather   ? Hypertension Maternal Grandfather   ? Alcohol abuse Paternal Grandmother   ? Depression Paternal Grandmother   ? Drug abuse Paternal Grandmother   ? Heart attack Paternal Grandmother   ? Hyperlipidemia Paternal Grandmother   ? Hypertension Paternal Grandmother   ? Heart disease Paternal Grandmother   ? Birth defects Paternal Grandfather   ? Eczema Son   ? ? ?Allergies  ?Allergen Reactions  ? Other Hives  ? Penicillins Hives  ? ? ?Medication list has been reviewed and updated. ? ?Current Outpatient Medications on File Prior to Visit  ?Medication Sig Dispense Refill  ? amLODipine (NORVASC) 5 MG tablet Take 1 tablet (5 mg total) by mouth daily. 90 tablet 3  ? aspirin EC 81 MG tablet Take 81 mg by mouth daily. Swallow whole.    ? celecoxib (CELEBREX) 200 MG capsule Take 1 capsule (200 mg total) by mouth daily. 90 capsule 3  ? cetirizine (ZYRTEC) 10 MG tablet Take 10 mg by mouth daily. Prn    ? clarithromycin (BIAXIN) 500 MG tablet Take by mouth.    ? gabapentin (NEURONTIN) 300 MG capsule Take 1 capsule (300 mg total) by mouth 2 (two) times daily. 180 capsule 3  ? metroNIDAZOLE (FLAGYL) 500 MG tablet Take 500 mg by mouth 3 (three) times daily.    ? omeprazole (PRILOSEC) 40 MG capsule Take by mouth.    ? rosuvastatin (CRESTOR) 20 MG tablet Take 1 tablet by mouth daily.    ? ?No current facility-administered medications on file prior to visit.  ? ? ?Review of Systems: ? ?As per HPI- otherwise negative. ? ? ?Physical Examination: ?Vitals:  ? 08/14/21 0819  ?BP: 118/78  ?Pulse: 66  ?Resp: 18  ?Temp: 98 ?F (36.7 ?Owens)  ?SpO2: 98%  ? ?Vitals:  ? 08/14/21 0819  ?Weight: 265 lb (120.2 kg)  ?Height: 5\' 2"  (1.575 m)  ? ?Body mass index is 48.47 kg/m?. ?Ideal Body Weight: Weight in (lb) to have BMI = 25: 136.4 ? ?GEN: no acute distress. Obese, looks well  ?HEENT: Atraumatic, Normocephalic.  Bilateral TM wnl, oropharynx normal.  PEERL,EOMI.   ?Ears and Nose: No external  deformity. ?CV: RRR, No M/G/R. No JVD. No thrill. No extra heart sounds. ?PULM: CTA B, no wheezes, crackles, rhonchi. No retractions. No resp. distress. No accessory muscle use. ?ABD: S, NT, ND, +BS. No rebound. No HSM. ?EXTR:  No Owens/Owens/e ?PSYCH: Normally interactive. Conversant.  ? ?EKG: NSR, compared with tracing from 10/21 no significant change is noted  ?Assessment and Plan: ?Preoperative clearance - Plan: EKG 12-Lead, Hemoglobin A1c, Protime-INR, CANCELED: Protime-INR, CANCELED: Protime-INR ? ?Patient seen today for preoperative clearance, she plans a gastric sleeve coming up soon ?CBC, CMP done in January ?We will update an A1c and INR today ?EKG is reassuring ? ?Fax clearance letter to [phone], attention Inetta Fermo- ? ?Signed ?Abbe Amsterdam, MD ? ?Received labs as follows ?Results for orders placed or performed in visit on 08/14/21  ?Hemoglobin A1c  ?Result Value Ref Range  ? Hgb A1c MFr Bld 6.0 4.6 - 6.5 %  ?Protime-INR  ?Result Value Ref Range  ? INR 1.0 0.8 - 1.0 ratio  ? Prothrombin Time 11.4 9.6 - 13.1 sec  ? ?Message to patient, wrote surgical clearance letter ? ?

## 2021-08-14 ENCOUNTER — Encounter: Payer: Self-pay | Admitting: Family Medicine

## 2021-08-14 ENCOUNTER — Ambulatory Visit: Payer: BLUE CROSS/BLUE SHIELD | Admitting: Family Medicine

## 2021-08-14 VITALS — BP 118/78 | HR 66 | Temp 98.0°F | Resp 18 | Ht 62.0 in | Wt 265.0 lb

## 2021-08-14 DIAGNOSIS — Z01818 Encounter for other preprocedural examination: Secondary | ICD-10-CM | POA: Diagnosis not present

## 2021-08-14 LAB — HEMOGLOBIN A1C: Hgb A1c MFr Bld: 6 % (ref 4.6–6.5)

## 2021-08-14 LAB — PROTIME-INR
INR: 1 ratio (ref 0.8–1.0)
Prothrombin Time: 11.4 s (ref 9.6–13.1)

## 2021-08-14 NOTE — Patient Instructions (Signed)
Good to see you today and best of luck with your surgery!  I will be in touch with your labs and will provide a surgical clearance letter ? ? ?

## 2021-08-28 ENCOUNTER — Ambulatory Visit: Payer: BLUE CROSS/BLUE SHIELD | Admitting: Internal Medicine

## 2021-09-10 NOTE — Progress Notes (Signed)
? ?Office Visit Note ? ?Patient: Janet Owens             ?Date of Birth: [birth year]           ?MRN: [id number]             ?PCP: Darreld Mclean, MD ?Referring: Dara Hoyer, FNP ?Visit Date: 09/11/2021 ?Occupation: [employer] ? ?Subjective:  ? ?History of Present Illness: Janet Owens is a 46 y.o. female here for evaluation of positive ANA and RNP antibodies checked associated with urticaria and angioedema. She has a history of intracranial artery stenosis seeing neurology for this also right sided carpal tunnel syndrome.  Problem started pretty acutely on November 4 last year.  She developed hives on her hand not excessively concerning but then proceeded within the next day to develop severe lip swelling.  She saw urgent clinic for this leading to ED was treated with IM methylprednisolone along with oral prednisone taper and antihistamine treatment started.  She saw allergy clinic is currently on maintenance antihistamines and more recently has not had any return of the severe swelling.   Also in the same timeframe seeing neurology with persistent headaches also having pain suspicious for cervical radiculopathy had imaging with CTA showing questionable plaques versus inflammatory changes on intracranial artery. ?In addition to all this she still has some chronic joint pain issues.  She has numbness intermittently in the right arm extending all the way from shoulder to hand comes intermittently and sometimes interferes with sleeping at night.  She had nerve conduction study demonstrating some carpal tunnel changes in the right wrist but otherwise normal.  She has known osteoarthritis in the cervical and lumbar spine and sees orthopedic surgery clinic for this problem.  She takes Celebrex once daily in the mornings which is beneficial for pain and stiffness.  She also reports a history of pediatric osteogenesis imperfecta with bone and scleral abnormalities but no particular history of  fractures. ? ? ?Labs reviewed ?05/2021 ?ANA pos ?RNP 1.5 ?dsDNA, SM, SSA, SSB neg ?C3 C4 wnl ?ESR 15 ?CBC wnl ?CMP wnl ?TSH wnl ? ?02/08/21 CTA Head ?CTA head: ?  ?1. Irregularity of the M2 and more distal middle cerebral artery vessels bilaterally. Most notably, there are multiple sites of moderate/severe stenosis within M2 left MCA vessels, and sites of up to moderate stenosis within M2 right MCA vessels. This may reflect age-advanced atherosclerotic disease. Alternative etiologies (such as a vasculitis) cannot be excluded. ?2. No intracranial aneurysm is identified. ? ?Activities of Daily Living:  ?Patient reports morning stiffness for 30 minutes.   ?Patient Reports nocturnal pain.  ?Difficulty dressing/grooming: Reports ?Difficulty climbing stairs: Denies ?Difficulty getting out of chair: Denies ?Difficulty using hands for taps, buttons, cutlery, and/or writing: Denies ? ?Review of Systems  ?Constitutional:  Positive for fatigue.  ?HENT:  Negative for mouth sores, mouth dryness and nose dryness.   ?Eyes:  Negative for pain, itching and dryness.  ?Respiratory:  Negative for shortness of breath and difficulty breathing.   ?Cardiovascular:  Negative for chest pain and palpitations.  ?Gastrointestinal:  Positive for constipation and diarrhea. Negative for blood in stool.  ?Endocrine: Negative for increased urination.  ?Genitourinary:  Negative for difficulty urinating.  ?Musculoskeletal:  Positive for joint pain, joint pain, myalgias, morning stiffness, muscle tenderness and myalgias. Negative for joint swelling.  ?Skin:  Negative for color change, rash and redness.  ?Allergic/Immunologic: Negative for susceptible to infections.  ?Neurological:  Positive for numbness and weakness. Negative for dizziness, headaches  and memory loss.  ?Hematological:  Positive for bruising/bleeding tendency.  ?Psychiatric/Behavioral:  Negative for confusion.   ? ?PMFS History:  ?Patient Active Problem List  ? Diagnosis Date Noted  ? Skin  tag 09/11/2021  ? Positive ANA (antinuclear antibody) 09/11/2021  ? Obstructive sleep apnea 06/21/2021  ? Urticaria 04/11/2021  ? Angio-edema 04/11/2021  ? Vitamin D deficiency 01/12/2021  ? Onychomycosis due to dermatophyte 12/12/2019  ? Essential hypertension 09/05/2019  ? Hyperlipidemia 09/05/2019  ? Class 3 severe obesity without serious comorbidity with body mass index (BMI) of 40.0 to 44.9 in adult [facility]) 09/05/2019  ? Spondylosis of cervical region without myelopathy or radiculopathy 09/05/2019  ?  ?Past Medical History:  ?Diagnosis Date  ? Angio-edema   ? Arthritis   ? Chickenpox   ? Hyperlipidemia   ? Hypertension   ? Urticaria   ?  ?Family History  ?Problem Relation Age of Onset  ? Urticaria Mother   ? Allergic rhinitis Mother   ? Arthritis Mother   ? Hyperlipidemia Mother   ? Hypertension Mother   ? Cancer Father   ? COPD Father   ? Depression Father   ? Drug abuse Father   ? Early death Father   ? Hearing loss Father   ? Stroke Father   ? Alcohol abuse Father   ? Deep vein thrombosis Father   ? Alcohol abuse Brother   ? Asthma Brother   ? Drug abuse Brother   ? Early death Brother   ? Alcohol abuse Maternal Grandmother   ? Hearing loss Maternal Grandmother   ? Miscarriages / Stillbirths Maternal Grandmother   ? Heart attack Maternal Grandfather   ? Hyperlipidemia Maternal Grandfather   ? Hypertension Maternal Grandfather   ? Alcohol abuse Paternal Grandmother   ? Depression Paternal Grandmother   ? Drug abuse Paternal Grandmother   ? Heart attack Paternal Grandmother   ? Hyperlipidemia Paternal Grandmother   ? Hypertension Paternal Grandmother   ? Heart disease Paternal Grandmother   ? Birth defects Paternal Grandfather   ? Allergies Daughter   ? Eczema Son   ? ?Past Surgical History:  ?Procedure Laterality Date  ? KNEE SURGERY  2002  ? ?Social History  ? ?Social History Narrative  ? Right Handed  ? Lives in a two story home   ? ?Immunization History  ?Administered Date(s) Administered  ?  Influenza-Unspecified 03/11/2017, 02/12/2019  ? MMR 03/27/1979, 04/30/2014  ? PFIZER(Purple Top)SARS-COV-2 Vaccination 08/25/2019, 09/16/2019  ? Tdap 02/24/2008, 12/24/2012  ? Varicella 03/27/1991  ?  ? ?Objective: ?Vital Signs: BP 140/88 (BP Location: Right Arm, Patient Position: Sitting, Cuff Size: Large)   Pulse 65   Ht 5' 2.5" (1.588 m)   Wt 260 lb 12.8 oz (118.3 kg)   BMI 46.94 kg/m?   ? ?Physical Exam ?Constitutional:   ?   Appearance: She is obese.  ?HENT:  ?   Right Ear: External ear normal.  ?   Left Ear: External ear normal.  ?   Mouth/Throat:  ?   Mouth: Mucous membranes are moist.  ?   Pharynx: Oropharynx is clear.  ?Eyes:  ?   Conjunctiva/sclera: Conjunctivae normal.  ?Cardiovascular:  ?   Rate and Rhythm: Normal rate and regular rhythm.  ?Pulmonary:  ?   Effort: Pulmonary effort is normal.  ?   Breath sounds: Normal breath sounds.  ?Skin: ?   General: Skin is warm and dry.  ?   Findings: No rash.  ?Neurological:  ?  Mental Status: She is alert.  ?Psychiatric:     ?   Mood and Affect: Mood normal.  ?  ? ?Musculoskeletal Exam:  ?Shoulders full ROM no tenderness or swelling ?Elbows full ROM no tenderness or swelling ?Wrists full ROM no tenderness or swelling ?Fingers full ROM no tenderness or swelling ?Knees full ROM no tenderness or swelling ?Ankles full ROM no tenderness or swelling ? ? ? ?Investigation: ?No additional findings. ? ?Imaging: ?No results found. ? ?Recent Labs: ?Lab Results  ?Component Value Date  ? WBC 7.2 05/29/2021  ? HGB 14.0 05/29/2021  ? PLT 381 05/29/2021  ? NA 137 05/29/2021  ? K 4.4 05/29/2021  ? CL 102 05/29/2021  ? CO2 23 05/29/2021  ? GLUCOSE 96 05/29/2021  ? BUN 11 05/29/2021  ? CREATININE 0.75 05/29/2021  ? BILITOT 0.2 05/29/2021  ? ALKPHOS 60 05/29/2021  ? AST 19 05/29/2021  ? ALT 20 05/29/2021  ? PROT 7.4 05/29/2021  ? ALBUMIN 4.7 05/29/2021  ? CALCIUM 9.8 05/29/2021  ? ? ?Speciality Comments: No specialty comments available. ? ?Procedures:  ?No procedures  performed ?Allergies: Other and Penicillins  ? ?Assessment / Plan:     ?Visit Diagnoses:  ?Positive ANA (antinuclear antibody) ? ?No specific inflammatory findings or clinical criteria at this time.  Her joint pain areas more related to some degener

## 2021-09-11 ENCOUNTER — Encounter: Payer: Self-pay | Admitting: Internal Medicine

## 2021-09-11 ENCOUNTER — Ambulatory Visit: Payer: BLUE CROSS/BLUE SHIELD | Admitting: Internal Medicine

## 2021-09-11 VITALS — BP 140/88 | HR 65 | Ht 62.5 in | Wt 260.8 lb

## 2021-09-11 DIAGNOSIS — T783XXD Angioneurotic edema, subsequent encounter: Secondary | ICD-10-CM | POA: Diagnosis not present

## 2021-09-11 DIAGNOSIS — R768 Other specified abnormal immunological findings in serum: Secondary | ICD-10-CM

## 2021-09-11 DIAGNOSIS — L918 Other hypertrophic disorders of the skin: Secondary | ICD-10-CM | POA: Insufficient documentation

## 2021-09-11 DIAGNOSIS — L509 Urticaria, unspecified: Secondary | ICD-10-CM | POA: Diagnosis not present

## 2021-09-23 DIAGNOSIS — Z713 Dietary counseling and surveillance: Secondary | ICD-10-CM | POA: Diagnosis not present

## 2021-10-04 DIAGNOSIS — Z6841 Body Mass Index (BMI) 40.0 and over, adult: Secondary | ICD-10-CM | POA: Diagnosis not present

## 2021-10-10 DIAGNOSIS — Z01818 Encounter for other preprocedural examination: Secondary | ICD-10-CM | POA: Diagnosis not present

## 2021-10-14 DIAGNOSIS — Z87891 Personal history of nicotine dependence: Secondary | ICD-10-CM | POA: Diagnosis not present

## 2021-10-14 DIAGNOSIS — Z6841 Body Mass Index (BMI) 40.0 and over, adult: Secondary | ICD-10-CM | POA: Diagnosis not present

## 2021-10-14 DIAGNOSIS — G4730 Sleep apnea, unspecified: Secondary | ICD-10-CM | POA: Diagnosis not present

## 2021-10-14 DIAGNOSIS — B9681 Helicobacter pylori [H. pylori] as the cause of diseases classified elsewhere: Secondary | ICD-10-CM | POA: Diagnosis not present

## 2021-10-14 DIAGNOSIS — K2950 Unspecified chronic gastritis without bleeding: Secondary | ICD-10-CM | POA: Diagnosis not present

## 2021-10-15 DIAGNOSIS — Z6841 Body Mass Index (BMI) 40.0 and over, adult: Secondary | ICD-10-CM | POA: Diagnosis not present

## 2021-10-15 DIAGNOSIS — Z87891 Personal history of nicotine dependence: Secondary | ICD-10-CM | POA: Diagnosis not present

## 2021-10-15 DIAGNOSIS — G4730 Sleep apnea, unspecified: Secondary | ICD-10-CM | POA: Diagnosis not present

## 2021-10-17 ENCOUNTER — Telehealth: Payer: BLUE CROSS/BLUE SHIELD | Admitting: Physician Assistant

## 2021-10-17 DIAGNOSIS — K6289 Other specified diseases of anus and rectum: Secondary | ICD-10-CM

## 2021-10-17 NOTE — Progress Notes (Signed)
Based on what you shared with me, I feel your condition warrants further evaluation and I recommend that you be seen in a face to face visit. Giving no formal prior diagnosis of a hemorrhoid and concern for possible thrombosed hemorrhoid versus anal fissure, you need to be evaluated in person for examination so most proper treatment(s) can be given.    NOTE: There will be NO CHARGE for this eVisit   If you are having a true medical emergency please call 911.      For an urgent face to face visit, [facility] has six urgent care centers for your convenience:     [facility] [place] [facility] [place] Get Driving Directions [id number] [place], [place] [address]    [facility]) Get Driving Directions [id number] [place], [place] [address]  [facility] [place] ([place]) Get Driving Directions [id number] [place],  [place]  [address]  [facility] Get Driving Directions [id number] [place], [place], [place] [address]   [facility] Get Driving Directions  [id number] [address] [place], [place] [address]   [facility] [place] Get Driving Directions [id number] [address] [place],  [address]  Your MyChart E-visit questionnaire answers were reviewed by a board certified advanced clinical practitioner to complete your personal care plan based on your specific symptoms.  Thank you for using e-Visits.

## 2021-10-18 ENCOUNTER — Telehealth: Payer: Self-pay | Admitting: Family

## 2021-10-18 ENCOUNTER — Ambulatory Visit: Payer: BLUE CROSS/BLUE SHIELD | Admitting: Family

## 2021-10-18 VITALS — BP 124/78 | HR 93 | Temp 97.7°F | Resp 16 | Ht 62.0 in | Wt 244.0 lb

## 2021-10-18 DIAGNOSIS — K649 Unspecified hemorrhoids: Secondary | ICD-10-CM | POA: Diagnosis not present

## 2021-10-18 MED ORDER — HYDROCORTISONE (PERIANAL) 2.5 % EX CREA: 1.0000 "application " | TOPICAL_CREAM | Freq: Three times a day (TID) | CUTANEOUS | 0 refills | Status: DC

## 2021-10-18 NOTE — Telephone Encounter (Signed)
Please call to let her know we are happy to see her this afternoon but am concerned that she had surgery 4 days ago; has she spoken her to surgeon?

## 2021-10-18 NOTE — Telephone Encounter (Signed)
Called Pt and will still come in

## 2021-10-18 NOTE — Progress Notes (Signed)
Janet Owens is a 46 y.o. female with the following history as recorded in EpicCare:  Patient Active Problem List   Diagnosis Date Noted   Skin tag 09/11/2021   Positive ANA (antinuclear antibody) 09/11/2021   Obstructive sleep apnea 06/21/2021   Urticaria 04/11/2021   Angio-edema 04/11/2021   Vitamin D deficiency 01/12/2021   Onychomycosis due to dermatophyte 12/12/2019   Essential hypertension 09/05/2019   Hyperlipidemia 09/05/2019   Class 3 severe obesity without serious comorbidity with body mass index (BMI) of 40.0 to 44.9 in adult [facility]) 09/05/2019   Spondylosis of cervical region without myelopathy or radiculopathy 09/05/2019    Current Outpatient Medications  Medication Sig Dispense Refill   amLODipine (NORVASC) 5 MG tablet Take 1 tablet (5 mg total) by mouth daily. 90 tablet 3   aspirin EC 81 MG tablet Take 81 mg by mouth daily. Swallow whole.     celecoxib (CELEBREX) 200 MG capsule Take 1 capsule (200 mg total) by mouth daily. 90 capsule 3   cetirizine (ZYRTEC) 10 MG tablet Take 10 mg by mouth daily.     Cholecalciferol (VITAMIN D) 50 MCG (2000 UT) CAPS Take 2,000 Units by mouth daily.     FIBER PO Take by mouth daily.     gabapentin (NEURONTIN) 300 MG capsule Take 1 capsule (300 mg total) by mouth 2 (two) times daily. (Patient taking differently: Take 300 mg by mouth at bedtime.) 180 capsule 3   hydrocortisone (ANUSOL-HC) 2.5 % rectal cream Place 1 application. rectally 3 (three) times daily. 30 g 0   lisinopril (ZESTRIL) 10 MG tablet Take 10 mg by mouth daily.     Probiotic Product (PROBIOTIC PO) Take by mouth daily.     rosuvastatin (CRESTOR) 20 MG tablet Take 1 tablet by mouth daily.     No current facility-administered medications for this visit.    Allergies: Other and Penicillins  Past Medical History:  Diagnosis Date   Angio-edema    Arthritis    Chickenpox    Hyperlipidemia    Hypertension    Urticaria     Past Surgical History:  Procedure Laterality  Date   KNEE SURGERY  2002    Family History  Problem Relation Age of Onset   Urticaria Mother    Allergic rhinitis Mother    Arthritis Mother    Hyperlipidemia Mother    Hypertension Mother    Cancer Father    COPD Father    Depression Father    Drug abuse Father    Early death Father    Hearing loss Father    Stroke Father    Alcohol abuse Father    Deep vein thrombosis Father    Alcohol abuse Brother    Asthma Brother    Drug abuse Brother    Early death Brother    Alcohol abuse Maternal Grandmother    Hearing loss Maternal Grandmother    Miscarriages / Stillbirths Maternal Grandmother    Heart attack Maternal Grandfather    Hyperlipidemia Maternal Grandfather    Hypertension Maternal Grandfather    Alcohol abuse Paternal Grandmother    Depression Paternal Grandmother    Drug abuse Paternal Grandmother    Heart attack Paternal Grandmother    Hyperlipidemia Paternal Grandmother    Hypertension Paternal Grandmother    Heart disease Paternal Grandmother    Birth defects Paternal Grandfather    Allergies Daughter    Eczema Son     Social History   Tobacco Use   Smoking status:  Former    Types: Cigarettes    Passive exposure: Never   Smokeless tobacco: Never  Substance Use Topics   Alcohol use: Yes    Comment: Once a month    Subjective:  Accompanied by husband; Hemorrhoids problematic x 2 months; limited relief with OTC medications; notes that symptoms are keeping her awake at night; does see occasional bright red blood;  Tested positive for H. Pylori in March and feels like antibiotics have caused irritation for symptoms; limited relief with OTC treatments; asking specifically for nitroglycerin cream which she saw on the internet;   Had weight loss surgery earlier this week- had gastric sleeve on 10/14/21 and doing well;      Objective:  Vitals:   10/18/21 1548  BP: 124/78  Pulse: 93  Resp: 16  Temp: 97.7 F (36.5 C)  TempSrc: Oral  SpO2: 98%   Weight: 244 lb (110.7 kg)  Height: 5\' 2"  (1.575 m)    General: Well developed, well nourished, in no acute distress  Skin : Warm and dry.  Head: Normocephalic and atraumatic  Lungs: Respirations unlabored;  Neurologic: Alert and oriented; speech intact; face symmetrical; moves all extremities well; CNII-XII intact without focal deficit  Rectal exam- unremarkable; no thrombosed hemorrhoid noted;   Assessment:  1. Hemorrhoids, unspecified hemorrhoid type     Plan:  Will refer to GI- ? Possible Anal fissure; will try treating with prescriptive hemorrhoid cream; explained that typically GI will determine if steroid injection or nitroglycerin cream is appropriate and further evaluation of rectal tissues is needed to make this determination;   No follow-ups on file.  Orders Placed This Encounter  Procedures   Ambulatory referral to Gastroenterology    Referral Priority:   Routine    Referral Type:   Consultation    Referral Reason:   Specialty Services Required    Number of Visits Requested:   1    Requested Prescriptions   Signed Prescriptions Disp Refills   hydrocortisone (ANUSOL-HC) 2.5 % rectal cream 30 g 0    Sig: Place 1 application. rectally 3 (three) times daily.

## 2021-10-25 ENCOUNTER — Telehealth: Payer: Self-pay | Admitting: Family Medicine

## 2021-10-25 ENCOUNTER — Ambulatory Visit: Payer: BLUE CROSS/BLUE SHIELD | Admitting: Family

## 2021-10-25 MED ORDER — ACETAMINOPHEN-CODEINE 300-30 MG PO TABS: 1.0000 | ORAL_TABLET | Freq: Four times a day (QID) | ORAL | 0 refills | Status: DC | PRN

## 2021-10-25 NOTE — Telephone Encounter (Signed)
Pt stated she was able to get in with GI on Monday but needs rx in the meantime she's in so much pain. Please advise

## 2021-10-25 NOTE — Telephone Encounter (Signed)
Called pt back and we spoke for over 10 minutes She describes rectal/anal pain which is apparently been present for several weeks, even before her surgery.  She thinks she has an anal fissure, but relates that the pain will seem to "radiate through my entire body".  She is on a liquid diet after her gastric sleeve surgery and notes her stools are very soft/liquid.  She used all of her pain medication from the surgery already-hydrocodone liquid She is using topical lidocaine, hydrocortisone cream, Tucks pads Advised patient that typically an anal fissure will not cause whole body pain.  Certainly if she is not okay I would recommend she go to the ER for further evaluation, it is possible there could be a complication from her surgery.  Otherwise I did give her some Tylenol 3 to use as needed for pain, as noted above her stools are currently liquid so constipation is not a big concern right now

## 2021-10-25 NOTE — Telephone Encounter (Signed)
Pt was seen last week by LM and prescribed Anusol. She also just had Laparoscopic bariatric surgery last week. Please advise.

## 2021-10-28 ENCOUNTER — Ambulatory Visit: Payer: BLUE CROSS/BLUE SHIELD | Admitting: Gastroenterology

## 2021-10-29 DIAGNOSIS — R197 Diarrhea, unspecified: Secondary | ICD-10-CM | POA: Diagnosis not present

## 2021-11-11 DIAGNOSIS — Z713 Dietary counseling and surveillance: Secondary | ICD-10-CM | POA: Diagnosis not present

## 2021-11-11 DIAGNOSIS — Z903 Acquired absence of stomach [part of]: Secondary | ICD-10-CM | POA: Diagnosis not present

## 2021-11-21 DIAGNOSIS — Z8619 Personal history of other infectious and parasitic diseases: Secondary | ICD-10-CM | POA: Diagnosis not present

## 2021-11-21 DIAGNOSIS — K644 Residual hemorrhoidal skin tags: Secondary | ICD-10-CM | POA: Diagnosis not present

## 2021-11-21 DIAGNOSIS — K6289 Other specified diseases of anus and rectum: Secondary | ICD-10-CM | POA: Diagnosis not present

## 2021-11-28 ENCOUNTER — Ambulatory Visit: Payer: BLUE CROSS/BLUE SHIELD | Admitting: Gastroenterology

## 2022-01-09 NOTE — Progress Notes (Deleted)
Office Visit Note  Patient: Janet Owens             Date of Birth: 1976/04/21           MRN: [id number]             PCP: Darreld Mclean, MD Referring: Darreld Mclean, MD Visit Date: 01/15/2022   Subjective:  No chief complaint on file.   History of Present Illness: Janet Owens is a 46 y.o. female here for follow up ***   Previous HPI 09/11/2021 Janet Owens is a 46 y.o. female here for evaluation of positive ANA and RNP antibodies checked associated with urticaria and angioedema. She has a history of intracranial artery stenosis seeing neurology for this also right sided carpal tunnel syndrome.  Problem started pretty acutely on November 4 last year.  She developed hives on her hand not excessively concerning but then proceeded within the next day to develop severe lip swelling.  She saw urgent clinic for this leading to ED was treated with IM methylprednisolone along with oral prednisone taper and antihistamine treatment started.  She saw allergy clinic is currently on maintenance antihistamines and more recently has not had any return of the severe swelling.   Also in the same timeframe seeing neurology with persistent headaches also having pain suspicious for cervical radiculopathy had imaging with CTA showing questionable plaques versus inflammatory changes on intracranial artery. In addition to all this she still has some chronic joint pain issues.  She has numbness intermittently in the right arm extending all the way from shoulder to hand comes intermittently and sometimes interferes with sleeping at night.  She had nerve conduction study demonstrating some carpal tunnel changes in the right wrist but otherwise normal.  She has known osteoarthritis in the cervical and lumbar spine and sees orthopedic surgery clinic for this problem.  She takes Celebrex once daily in the mornings which is beneficial for pain and stiffness.  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     Labs reviewed 05/2021 ANA pos RNP 1.5 dsDNA, SM, SSA, SSB neg C3 C4 wnl ESR 15 CBC wnl CMP wnl TSH wnl   02/08/21 CTA Head CTA head:   1. Irregularity of the M2 and more distal middle cerebral artery vessels bilaterally. Most notably, there are multiple sites of moderate/severe stenosis within M2 left MCA vessels, and sites of up to moderate stenosis within M2 right MCA vessels. This may reflect age-advanced atherosclerotic disease. Alternative etiologies (such as a vasculitis) cannot be excluded. 2. No intracranial aneurysm is identified.   No Rheumatology ROS completed.   PMFS History:  Patient Active Problem List   Diagnosis Date Noted   Skin tag 09/11/2021   Positive ANA (antinuclear antibody) 09/11/2021   Obstructive sleep apnea 06/21/2021   Urticaria 04/11/2021   Angio-edema 04/11/2021   Vitamin D deficiency 01/12/2021   Onychomycosis due to dermatophyte 12/12/2019   Essential hypertension 09/05/2019   Hyperlipidemia 09/05/2019   Class 3 severe obesity without serious comorbidity with body mass index (BMI) of 40.0 to 44.9 in adult [facility]) 09/05/2019   Spondylosis of cervical region without myelopathy or radiculopathy 09/05/2019    Past Medical History:  Diagnosis Date   Angio-edema    Arthritis    Chickenpox    Hyperlipidemia    Hypertension    Urticaria     Family History  Problem Relation Age of Onset  Urticaria Mother    Allergic rhinitis Mother    Arthritis Mother    Hyperlipidemia Mother    Hypertension Mother    Cancer Father    COPD Father    Depression Father    Drug abuse Father    Early death Father    Hearing loss Father    Stroke Father    Alcohol abuse Father    Deep vein thrombosis Father    Alcohol abuse Brother    Asthma Brother    Drug abuse Brother    Early death Brother    Alcohol abuse Maternal Grandmother    Hearing loss  Maternal Grandmother    Miscarriages / Stillbirths Maternal Grandmother    Heart attack Maternal Grandfather    Hyperlipidemia Maternal Grandfather    Hypertension Maternal Grandfather    Alcohol abuse Paternal Grandmother    Depression Paternal Grandmother    Drug abuse Paternal Grandmother    Heart attack Paternal Grandmother    Hyperlipidemia Paternal Grandmother    Hypertension Paternal Grandmother    Heart disease Paternal Grandmother    Birth defects Paternal Grandfather    Allergies Daughter    Eczema Son    Past Surgical History:  Procedure Laterality Date   KNEE SURGERY  2002   Social History   Social History Narrative   Right Handed   Lives in a two story home    Immunization History  Administered Date(s) Administered   Influenza-Unspecified 03/11/2017, 02/12/2019   MMR 03/27/1979, 04/30/2014   PFIZER Comirnaty(Gray Top)Covid-19 Tri-Sucrose Vaccine 08/25/2019   PFIZER(Purple Top)SARS-COV-2 Vaccination 08/25/2019, 09/16/2019   Tdap 02/24/2008, 12/24/2012   Varicella 03/27/1991     Objective: Vital Signs: There were no vitals taken for this visit.   Physical Exam   Musculoskeletal Exam: ***  CDAI Exam: CDAI Score: -- Patient Global: --; Provider Global: -- Swollen: --; Tender: -- Joint Exam 01/15/2022   No joint exam has been documented for this visit   There is currently no information documented on the homunculus. Go to the Rheumatology activity and complete the homunculus joint exam.  Investigation: No additional findings.  Imaging: No results found.  Recent Labs: Lab Results  Component Value Date   WBC 7.2 05/29/2021   HGB 14.0 05/29/2021   PLT 381 05/29/2021   NA 137 05/29/2021   K 4.4 05/29/2021   CL 102 05/29/2021   CO2 23 05/29/2021   GLUCOSE 96 05/29/2021   BUN 11 05/29/2021   CREATININE 0.75 05/29/2021   BILITOT 0.2 05/29/2021   ALKPHOS 60 05/29/2021   AST 19 05/29/2021   ALT 20 05/29/2021   PROT 7.4 05/29/2021   ALBUMIN  4.7 05/29/2021   CALCIUM 9.8 05/29/2021    Speciality Comments: No specialty comments available.  Procedures:  No procedures performed Allergies: Other and Penicillins   Assessment / Plan:     Visit Diagnoses: No diagnosis found.  ***  Orders: No orders of the defined types were placed in this encounter.  No orders of the defined types were placed in this encounter.    Follow-Up Instructions: No follow-ups on file.   Bertram Savin, RT  Note - This record has been created using Editor, commissioning.  Chart creation errors have been sought, but may not always  have been located. Such creation errors do not reflect on  the standard of medical care.

## 2022-01-15 ENCOUNTER — Ambulatory Visit: Payer: BLUE CROSS/BLUE SHIELD | Admitting: Internal Medicine

## 2022-01-15 DIAGNOSIS — R768 Other specified abnormal immunological findings in serum: Secondary | ICD-10-CM

## 2022-01-15 DIAGNOSIS — L509 Urticaria, unspecified: Secondary | ICD-10-CM

## 2022-01-15 DIAGNOSIS — T783XXD Angioneurotic edema, subsequent encounter: Secondary | ICD-10-CM

## 2022-02-27 ENCOUNTER — Other Ambulatory Visit: Payer: Self-pay

## 2022-02-27 ENCOUNTER — Telehealth: Payer: Self-pay | Admitting: Family Medicine

## 2022-02-27 MED ORDER — ROSUVASTATIN CALCIUM 20 MG PO TABS: 20.0000 mg | ORAL_TABLET | Freq: Every day | ORAL | 0 refills | Status: DC

## 2022-02-27 NOTE — Telephone Encounter (Signed)
Medication: rosuvastatin (CRESTOR) 20 MG tablet [[id number]]   Patient is requesting 10 day supply to be sent to [facility] on [address] in [place] and the remaining prescription to be sent to [facility]  Has the patient contacted their pharmacy? Yes.    Still showing pending  Preferred Pharmacy (with phone number or street name):  [place], [place] [address], [place] [address] Phone: [phone]  Fax: [phone]    Agent: Please be advised that RX refills may take up to 3 business days. We ask that you follow-up with your pharmacy.

## 2022-02-27 NOTE — Telephone Encounter (Signed)
Letter sent to the pt via mychart to set appointment- 30 day rx sent.

## 2022-03-03 ENCOUNTER — Telehealth: Payer: Self-pay | Admitting: Anesthesiology

## 2022-03-03 NOTE — Telephone Encounter (Signed)
Pt called no answer left a voice mail to call the office back with her questions for both appointments

## 2022-03-03 NOTE — Telephone Encounter (Signed)
Patient has questions about an MRI appointment that she needs, and also her follow up appointment with Dr Posey Pronto.

## 2022-03-04 NOTE — Telephone Encounter (Signed)
Called patient and she wanted to know about a MRI that Dr, Kathyrn Sheriff ordered from [facility]. I advised patient to please direct her questions to the ordering physician for her MRI. She states she did give them a call and was just waiting on a call back.   Patient also wanted to ask if Dr. Posey Pronto was going to do labs at her f/u appt because she had some labs done with her PCP already. I advised patient that she may bring those results in or have them sent to Dr. Posey Pronto for her review.   Patient verbalized understanding and had no further questions or concerns.

## 2022-03-07 ENCOUNTER — Ambulatory Visit: Payer: BLUE CROSS/BLUE SHIELD | Admitting: Neurology

## 2022-03-08 NOTE — Patient Instructions (Incomplete)
It was great to see you again today!  Recommend getting the latest COVID booster this fall and a flu shot Your BP is on the low side- cut amlodipine back to 2.5 mg If you continue to run BP under 130/85 stop taking it entirely   I will be in touch with your labs asap

## 2022-03-08 NOTE — Progress Notes (Unsigned)
[facility] [address], [place], [place] [address] [phone]  Date:  03/10/2022   Name:  Janet Owens   DOB:  [birth year]   MRN:  [id number]  PCP:  Darreld Mclean, MD    Chief Complaint: No chief complaint on file.   History of Present Illness:  Janet Owens is a 46 y.o. very pleasant female patient who presents with the following:  Patient seen today for follow-up, medication refills- History of hypertension, dyslipidemia, obesity   Most recent visit with myself was in March; at that time she was looking toward having gastric sleeve surgery-she had her gastric sleeve over the summer!   Recommend COVID booster Flu shot Pap Smear, mammogram up-to-date  Patient Active Problem List   Diagnosis Date Noted   Skin tag 09/11/2021   Positive ANA (antinuclear antibody) 09/11/2021   Obstructive sleep apnea 06/21/2021   Urticaria 04/11/2021   Angio-edema 04/11/2021   Vitamin D deficiency 01/12/2021   Onychomycosis due to dermatophyte 12/12/2019   Essential hypertension 09/05/2019   Hyperlipidemia 09/05/2019   Class 3 severe obesity without serious comorbidity with body mass index (BMI) of 40.0 to 44.9 in adult [facility]) 09/05/2019   Spondylosis of cervical region without myelopathy or radiculopathy 09/05/2019    Past Medical History:  Diagnosis Date   Angio-edema    Arthritis    Chickenpox    Hyperlipidemia    Hypertension    Urticaria     Past Surgical History:  Procedure Laterality Date   KNEE SURGERY  2002    Social History   Tobacco Use   Smoking status: Former    Types: Cigarettes    Passive exposure: Never   Smokeless tobacco: Never  Vaping Use   Vaping Use: Never used  Substance Use Topics   Alcohol use: Yes    Comment: Once a month   Drug use: Never    Family History  Problem Relation Age of Onset   Urticaria Mother    Allergic rhinitis Mother    Arthritis Mother     Hyperlipidemia Mother    Hypertension Mother    Cancer Father    COPD Father    Depression Father    Drug abuse Father    Early death Father    Hearing loss Father    Stroke Father    Alcohol abuse Father    Deep vein thrombosis Father    Alcohol abuse Brother    Asthma Brother    Drug abuse Brother    Early death Brother    Alcohol abuse Maternal Grandmother    Hearing loss Maternal Grandmother    Miscarriages / Stillbirths Maternal Grandmother    Heart attack Maternal Grandfather    Hyperlipidemia Maternal Grandfather    Hypertension Maternal Grandfather    Alcohol abuse Paternal Grandmother    Depression Paternal Grandmother    Drug abuse Paternal Grandmother    Heart attack Paternal Grandmother    Hyperlipidemia Paternal Grandmother    Hypertension Paternal Grandmother    Heart disease Paternal Grandmother    Birth defects Paternal Grandfather    Allergies Daughter    Eczema Son     Allergies  Allergen Reactions   Other Hives   Penicillins Hives    Medication list has been reviewed and updated.  Current Outpatient Medications on File Prior to Visit  Medication Sig Dispense Refill   acetaminophen-codeine (TYLENOL #3) 300-30 MG tablet Take 1 tablet by  mouth every 6 (six) hours as needed for moderate pain. 20 tablet 0   amLODipine (NORVASC) 5 MG tablet Take 1 tablet (5 mg total) by mouth daily. 90 tablet 3   aspirin EC 81 MG tablet Take 81 mg by mouth daily. Swallow whole.     celecoxib (CELEBREX) 200 MG capsule Take 1 capsule (200 mg total) by mouth daily. 90 capsule 3   cetirizine (ZYRTEC) 10 MG tablet Take 10 mg by mouth daily.     Cholecalciferol (VITAMIN D) 50 MCG (2000 UT) CAPS Take 2,000 Units by mouth daily.     FIBER PO Take by mouth daily.     gabapentin (NEURONTIN) 300 MG capsule Take 1 capsule (300 mg total) by mouth 2 (two) times daily. (Patient taking differently: Take 300 mg by mouth at bedtime.) 180 capsule 3   hydrocortisone (ANUSOL-HC) 2.5 %  rectal cream Place 1 application. rectally 3 (three) times daily. 30 g 0   lisinopril (ZESTRIL) 10 MG tablet Take 10 mg by mouth daily.     Probiotic Product (PROBIOTIC PO) Take by mouth daily.     rosuvastatin (CRESTOR) 20 MG tablet Take 1 tablet (20 mg total) by mouth daily. 30 tablet 0   No current facility-administered medications on file prior to visit.    Review of Systems:  As per HPI- otherwise negative.   Physical Examination: There were no vitals filed for this visit. There were no vitals filed for this visit. There is no height or weight on file to calculate BMI. Ideal Body Weight:    GEN: no acute distress. HEENT: Atraumatic, Normocephalic.  Ears and Nose: No external deformity. CV: RRR, No M/G/R. No JVD. No thrill. No extra heart sounds. PULM: CTA B, no wheezes, crackles, rhonchi. No retractions. No resp. distress. No accessory muscle use. ABD: S, NT, ND, +BS. No rebound. No HSM. EXTR: No c/c/e PSYCH: Normally interactive. Conversant.    Assessment and Plan: ***  Signed Abbe Amsterdam, MD

## 2022-03-10 ENCOUNTER — Ambulatory Visit: Payer: BLUE CROSS/BLUE SHIELD | Admitting: Family Medicine

## 2022-03-10 VITALS — BP 114/72 | HR 62 | Temp 97.8°F | Resp 18 | Ht 62.0 in | Wt 206.0 lb

## 2022-03-10 DIAGNOSIS — Z903 Acquired absence of stomach [part of]: Secondary | ICD-10-CM | POA: Diagnosis not present

## 2022-03-10 DIAGNOSIS — Z131 Encounter for screening for diabetes mellitus: Secondary | ICD-10-CM

## 2022-03-10 DIAGNOSIS — Z1329 Encounter for screening for other suspected endocrine disorder: Secondary | ICD-10-CM | POA: Diagnosis not present

## 2022-03-10 DIAGNOSIS — E782 Mixed hyperlipidemia: Secondary | ICD-10-CM

## 2022-03-10 DIAGNOSIS — I10 Essential (primary) hypertension: Secondary | ICD-10-CM

## 2022-03-10 DIAGNOSIS — E559 Vitamin D deficiency, unspecified: Secondary | ICD-10-CM

## 2022-03-11 ENCOUNTER — Encounter: Payer: Self-pay | Admitting: Family Medicine

## 2022-03-11 DIAGNOSIS — Q046 Congenital cerebral cysts: Secondary | ICD-10-CM | POA: Diagnosis not present

## 2022-03-11 DIAGNOSIS — E348 Other specified endocrine disorders: Secondary | ICD-10-CM | POA: Diagnosis not present

## 2022-03-11 LAB — COMPREHENSIVE METABOLIC PANEL
ALT: 13 U/L (ref 0–35)
AST: 12 U/L (ref 0–37)
Albumin: 4.3 g/dL (ref 3.5–5.2)
Alkaline Phosphatase: 39 U/L (ref 39–117)
BUN: 12 mg/dL (ref 6–23)
CO2: 25 mEq/L (ref 19–32)
Calcium: 9.5 mg/dL (ref 8.4–10.5)
Chloride: 104 mEq/L (ref 96–112)
Creatinine, Ser: 0.7 mg/dL (ref 0.40–1.20)
GFR: 105.53 mL/min (ref >60.00)
Glucose, Bld: 76 mg/dL (ref 70–99)
Potassium: 3.7 mEq/L (ref 3.5–5.1)
Sodium: 138 mEq/L (ref 135–145)
Total Bilirubin: 0.5 mg/dL (ref 0.2–1.2)
Total Protein: 6.8 g/dL (ref 6.0–8.3)

## 2022-03-11 LAB — LIPID PANEL
Cholesterol: 110 mg/dL (ref 0–200)
HDL: 46.2 mg/dL (ref >39.00)
LDL Cholesterol: 53 mg/dL (ref 0–99)
NonHDL: 63.77
Total CHOL/HDL Ratio: 2
Triglycerides: 56 mg/dL (ref 0.0–149.0)
VLDL: 11.2 mg/dL (ref 0.0–40.0)

## 2022-03-11 LAB — CBC
HCT: 38.1 % (ref 36.0–46.0)
Hemoglobin: 12.6 g/dL (ref 12.0–15.0)
MCHC: 33 g/dL (ref 30.0–36.0)
MCV: 90 fl (ref 78.0–100.0)
Platelets: 271 10*3/uL (ref 150.0–400.0)
RBC: 4.23 Mil/uL (ref 3.87–5.11)
RDW: 14.5 % (ref 11.5–15.5)
WBC: 7.4 10*3/uL (ref 4.0–10.5)

## 2022-03-11 LAB — TSH: TSH: 1.25 u[IU]/mL (ref 0.35–5.50)

## 2022-03-11 LAB — FOLATE: Folate: 20.2 ng/mL (ref >5.9)

## 2022-03-11 LAB — HEMOGLOBIN A1C: Hgb A1c MFr Bld: 5.4 % (ref 4.6–6.5)

## 2022-03-11 LAB — VITAMIN D 25 HYDROXY (VIT D DEFICIENCY, FRACTURES): VITD: 37.66 ng/mL (ref 30.00–100.00)

## 2022-03-11 LAB — VITAMIN B12: Vitamin B-12: 132 pg/mL — ABNORMAL LOW (ref 211–911)

## 2022-03-11 LAB — FERRITIN: Ferritin: 20.1 ng/mL (ref 10.0–291.0)

## 2022-03-11 NOTE — Progress Notes (Unsigned)
Follow-up Visit   Date: 03/12/22   Janet Owens MRN: [id number] DOB: 1975-11-24   Interim History: Janet Owens is a 46 y.o. right-handed female with hypertension and hyperlipidemia returning to the clinic for follow-up of headaches and intracranial stenosis.  The patient was accompanied to the clinic by self.  History of present illness: Starting in February 2022, she began having spells of right arm numbness which would wake her up from sleeping.  She tried various exercises to get relief.  It generally lasted about 10-30 minutes. In August, she began having worsening symptoms occurring every time she was trying to lay down or even with standing.  She was started on gabapentin 300mg  twice daily which has helped.  She still wakes up with her arm numb, but she is able to rest and go to sleep.    Also in August, she began having headaches with intercourse.  She complains of "thunderclap headache".  Pain is left sided headache, described as stabbing pain and lasts 30-minutes.  She complains of headaches almost daily headache.  She takes tylenol for headache.   She denies right face or leg parethesia.  No left sided symptoms.  Father has stroke at the age of 42.   She underwent MRI cervical spine in August which did not show any nerve impingement.  NCS/EMG of the right arm showed mild CTS.  CTA head was performed due to headaches and shows bilateral M2 MCA stenosis, worse on the left, as well as calcified coilloid cyst and pineal cyst.  She was evaluated by neurosurgery who recommended repeat imaging of the cyst in 6 months and follow-up here.   UPDATE 06/07/2021:  She is here for follow-up.  She has been compliant with plavix 75mg , aspirin 13m, and Crestor 40mg .  She continues to have nightly spells of right arm numbness, lasting 10-min.   She has reduced gabapentin to 300mg  at bedtime only.  She been intimidate with anyone so has not taken propranolol for headaches.   She reports  having allergic reaction to something, however, allergy testing was negative. She will be seeing rheumatology for further evaluation.  She is on Zyrtec.  She has many questions about her generalized health and plans for bariatric surgery which were addressed.   UPDATE 03/12/2022:   She had gastric sleeve surgery in April and has lost 80lb. She takes gabapentin 300mg  at bedtime which has prevented her right arm from falling asleep.  She is not using a wrist brace. She does not have headaches frequently, only when under stress and she does not treat them.  She had vessel imaging yesterday, ordered by Dr. office - results are not available.  No new neurological symptoms.     Medications:  Current Outpatient Medications on File Prior to Visit  Medication Sig Dispense Refill   amLODipine (NORVASC) 5 MG tablet Take 1 tablet (5 mg total) by mouth daily. 90 tablet 3   aspirin EC 81 MG tablet Take 81 mg by mouth daily. Swallow whole.     celecoxib (CELEBREX) 200 MG capsule Take 1 capsule (200 mg total) by mouth daily. 90 capsule 3   cetirizine (ZYRTEC) 10 MG tablet Take 10 mg by mouth daily.     Cholecalciferol (VITAMIN D) 50 MCG (2000 UT) CAPS Take 2,000 Units by mouth daily.     docusate sodium (COLACE) 100 MG capsule      Echinacea 350 MG CAPS      famotidine (PEPCID) 20 MG tablet  FIBER PO Take by mouth daily.     gabapentin (NEURONTIN) 300 MG capsule Take 1 capsule (300 mg total) by mouth 2 (two) times daily. (Patient taking differently: Take 300 mg by mouth at bedtime.) 180 capsule 3   rosuvastatin (CRESTOR) 20 MG tablet Take 1 tablet (20 mg total) by mouth daily. 30 tablet 0   UNABLE TO FIND Med Name: Mushroom Trip     UNABLE TO FIND One a day Women's Metabolism     No current facility-administered medications on file prior to visit.    Allergies:  Allergies  Allergen Reactions   Other Hives   Penicillins Hives   Sulfa Antibiotics Hives    Vital Signs:  BP (!) 148/90    Ht 5\' 2"  (1.575 m)   Wt 202 lb (91.6 kg)   SpO2 98%   BMI 36.95 kg/m   Neurological Exam: MENTAL STATUS including orientation to time, place, person, recent and remote memory, attention span and concentration, language, and fund of knowledge is normal.  Speech is not dysarthric.  CRANIAL NERVES: .   Normal conjugate, extra-ocular eye movements in all directions of gaze.  No ptosis.  Face is symmetric.   MOTOR:  Motor strength is antigravity throughout.    COORDINATION/GAIT:   Data: CTA head and neck 02/08/2021: 1. No evidence of acute intracranial abnormality. 2. 2 mm hyperdense focus at midline at the level of the foramina of [place], which may reflect calcification within choroid plexus, or may reflect a small partly calcified colloid cyst. No evidence of hydrocephalus. 3. Partially empty sella turcica. This finding is commonly incidental, but can be associated with idiopathic intracranial hypertension. 4. 8 mm pineal cyst. 5. Otherwise unremarkable non-contrast CT appearance of the brain.   CTA neck: The common carotid, internal carotid and vertebral arteries are patent within the neck without stenosis.   CTA head: 1. Irregularity of the M2 and more distal middle cerebral artery vessels bilaterally. Most notably, there are multiple sites of moderate/severe stenosis within M2 left MCA vessels, and sites of up to moderate stenosis within M2 right MCA vessels. This may reflect age-advanced atherosclerotic disease. Alternative etiologies (such as a vasculitis) cannot be excluded. 2. No intracranial aneurysm is identified.  MRI brain wo contrast 03/26/2021:  Unchanged 7 mm pineal cyst, otherwise unremarkable appearance of the brain. No acute intracranial abnormality.   NCS/EMG of the right arm 02/05/2021:  The above electrodiagnostic study is ABNORMAL and reveals evidence of a mild to moderate right median nerve entrapment at the wrist (carpal tunnel syndrome) affecting sensory  components.  This would not necessarily explain the totality of her symptoms.  Lab Results  Component Value Date   CHOL 110 03/10/2022   HDL 46.20 03/10/2022   LDLCALC 53 03/10/2022   TRIG 56.0 03/10/2022   CHOLHDL 2 03/10/2022   Lab Results  Component Value Date   VITAMINB12 132 (L) 03/10/2022     IMPRESSION/PLAN: Right arm paresthesias contributing be carpal tunnel syndrome - Encouraged to wear a wrist brace at nighttime - Continue gabapentin 300mg  at bedtime  2. Intracranial stenosis with bilateral M2 stenosis, worse on the right. Asymptomatic. MRI brain did not show acute stroke.  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  She has been on dual antiplatelet therapy, now on aspirin 81mg .  Repeat imaging was performed yesterday by Dr. , I do not have these images to review.   - LDL significantly imporved 168 >> 53 on statin therapy  - Continue Crestor 20mg  daily  -  Continue aspirin 81mg  daily  - BP was mildly elevated today, but improved on recheck.  Advised to follow BP at home and see PCP if it remains elevated  3. Headache associated with sexual activity, improved  - Previously offered propranolol, which she only used once  - Avoid triptans with known intracranial stenosis  Return to clinic in 1 year  .   Thank you for allowing me to participate in patient's care.  If I can answer any additional questions, I would be pleased to do so.    Sincerely,    Sweet Jarvis K. Posey Pronto, DO

## 2022-03-12 ENCOUNTER — Other Ambulatory Visit: Payer: Self-pay

## 2022-03-12 ENCOUNTER — Telehealth: Payer: BLUE CROSS/BLUE SHIELD | Admitting: Neurology

## 2022-03-12 ENCOUNTER — Encounter: Payer: Self-pay | Admitting: Neurology

## 2022-03-12 VITALS — BP 129/78 | Ht 62.0 in | Wt 202.0 lb

## 2022-03-12 DIAGNOSIS — I679 Cerebrovascular disease, unspecified: Secondary | ICD-10-CM | POA: Diagnosis not present

## 2022-03-12 DIAGNOSIS — G5601 Carpal tunnel syndrome, right upper limb: Secondary | ICD-10-CM | POA: Diagnosis not present

## 2022-03-12 MED ORDER — ROSUVASTATIN CALCIUM 20 MG PO TABS: 20.0000 mg | ORAL_TABLET | Freq: Every day | ORAL | 0 refills | Status: DC

## 2022-03-12 MED ORDER — GABAPENTIN 300 MG PO CAPS: 300.0000 mg | ORAL_CAPSULE | Freq: Every day | ORAL | 3 refills | Status: DC

## 2022-03-12 NOTE — Patient Instructions (Signed)
Start vitamin B12 1077mcg daily  Start using wrist brace at night time  Monitor your blood pressure at home  Return to clinic in 1 year

## 2022-03-18 NOTE — Progress Notes (Unsigned)
Office Visit Note  Patient: Janet Owens             Date of Birth: 11/29/1975           MRN: [id number]             PCP: Darreld Mclean, MD Referring: Darreld Mclean, MD Visit Date: 03/19/2022   Subjective:  No chief complaint on file.   History of Present Illness: Janet Owens is a 46 y.o. female here for follow up ***   Previous HPI 09/11/21 Janet Owens is a 46 y.o. female here for evaluation of positive ANA and RNP antibodies checked associated with urticaria and angioedema. She has a history of intracranial artery stenosis seeing neurology for this also right sided carpal tunnel syndrome.  Problem started pretty acutely on November 4 last year.  She developed hives on her hand not excessively concerning but then proceeded within the next day to develop severe lip swelling.  She saw urgent clinic for this leading to ED was treated with IM methylprednisolone along with oral prednisone taper and antihistamine treatment started.  She saw allergy clinic is currently on maintenance antihistamines and more recently has not had any return of the severe swelling.   Also in the same timeframe seeing neurology with persistent headaches also having pain suspicious for cervical radiculopathy had imaging with CTA showing questionable plaques versus inflammatory changes on intracranial artery. In addition to all this she still has some chronic joint pain issues.  She has numbness intermittently in the right arm extending all the way from shoulder to hand comes intermittently and sometimes interferes with sleeping at night.  She had nerve conduction study demonstrating some carpal tunnel changes in the right wrist but otherwise normal.  She has known osteoarthritis in the cervical and lumbar spine and sees orthopedic surgery clinic for this problem.  She takes Celebrex once daily in the mornings which is beneficial for pain and stiffness.  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     Labs reviewed 05/2021 ANA pos RNP 1.5 dsDNA, SM, SSA, SSB neg C3 C4 wnl ESR 15 CBC wnl CMP wnl TSH wnl   02/08/21 CTA Head CTA head:   1. Irregularity of the M2 and more distal middle cerebral artery vessels bilaterally. Most notably, there are multiple sites of moderate/severe stenosis within M2 left MCA vessels, and sites of up to moderate stenosis within M2 right MCA vessels. This may reflect age-advanced atherosclerotic disease. Alternative etiologies (such as a vasculitis) cannot be excluded. 2. No intracranial aneurysm is identified.   No Rheumatology ROS completed.   PMFS History:  Patient Active Problem List   Diagnosis Date Noted   Skin tag 09/11/2021   Positive ANA (antinuclear antibody) 09/11/2021   Obstructive sleep apnea 06/21/2021   Urticaria 04/11/2021   Angio-edema 04/11/2021   Vitamin D deficiency 01/12/2021   Onychomycosis due to dermatophyte 12/12/2019   Essential hypertension 09/05/2019   Hyperlipidemia 09/05/2019   Class 3 severe obesity without serious comorbidity with body mass index (BMI) of 40.0 to 44.9 in adult [facility]) 09/05/2019   Spondylosis of cervical region without myelopathy or radiculopathy 09/05/2019    Past Medical History:  Diagnosis Date   Angio-edema    Arthritis    Chickenpox    Hyperlipidemia    Hypertension    Urticaria     Family History  Problem Relation Age of Onset  Urticaria Mother    Allergic rhinitis Mother    Arthritis Mother    Hyperlipidemia Mother    Hypertension Mother    Cancer Father    COPD Father    Depression Father    Drug abuse Father    Early death Father    Hearing loss Father    Stroke Father    Alcohol abuse Father    Deep vein thrombosis Father    Alcohol abuse Brother    Asthma Brother    Drug abuse Brother    Early death Brother    Alcohol abuse Maternal Grandmother    Hearing loss  Maternal Grandmother    Miscarriages / Stillbirths Maternal Grandmother    Heart attack Maternal Grandfather    Hyperlipidemia Maternal Grandfather    Hypertension Maternal Grandfather    Alcohol abuse Paternal Grandmother    Depression Paternal Grandmother    Drug abuse Paternal Grandmother    Heart attack Paternal Grandmother    Hyperlipidemia Paternal Grandmother    Hypertension Paternal Grandmother    Heart disease Paternal Grandmother    Birth defects Paternal Grandfather    Allergies Daughter    Eczema Son    Past Surgical History:  Procedure Laterality Date   KNEE SURGERY  2002   Social History   Social History Narrative   Right Handed   Lives in a two story home    Immunization History  Administered Date(s) Administered   Influenza-Unspecified 03/11/2017, 02/12/2019   MMR 03/27/1979, 04/30/2014   PFIZER Comirnaty(Gray Top)Covid-19 Tri-Sucrose Vaccine 08/25/2019   PFIZER(Purple Top)SARS-COV-2 Vaccination 08/25/2019, 09/16/2019   Tdap 02/24/2008, 12/24/2012   Varicella 03/27/1991     Objective: Vital Signs: There were no vitals taken for this visit.   Physical Exam   Musculoskeletal Exam: ***  CDAI Exam: CDAI Score: -- Patient Global: --; Provider Global: -- Swollen: --; Tender: -- Joint Exam 03/19/2022   No joint exam has been documented for this visit   There is currently no information documented on the homunculus. Go to the Rheumatology activity and complete the homunculus joint exam.  Investigation: No additional findings.  Imaging: No results found.  Recent Labs: Lab Results  Component Value Date   WBC 7.4 03/10/2022   HGB 12.6 03/10/2022   PLT 271.0 03/10/2022   NA 138 03/10/2022   K 3.7 03/10/2022   CL 104 03/10/2022   CO2 25 03/10/2022   GLUCOSE 76 03/10/2022   BUN 12 03/10/2022   CREATININE 0.70 03/10/2022   BILITOT 0.5 03/10/2022   ALKPHOS 39 03/10/2022   AST 12 03/10/2022   ALT 13 03/10/2022   PROT 6.8 03/10/2022   ALBUMIN  4.3 03/10/2022   CALCIUM 9.5 03/10/2022    Speciality Comments: No specialty comments available.  Procedures:  No procedures performed Allergies: Other, Penicillins, and Sulfa antibiotics   Assessment / Plan:     Visit Diagnoses: No diagnosis found.  ***  Orders: No orders of the defined types were placed in this encounter.  No orders of the defined types were placed in this encounter.    Follow-Up Instructions: No follow-ups on file.   Collier Salina, MD  Note - This record has been created using [employer].  Chart creation errors have been sought, but may not always  have been located. Such creation errors do not reflect on  the standard of medical care.

## 2022-03-19 ENCOUNTER — Encounter: Payer: Self-pay | Admitting: Internal Medicine

## 2022-03-19 ENCOUNTER — Ambulatory Visit: Payer: BLUE CROSS/BLUE SHIELD | Attending: Internal Medicine | Admitting: Internal Medicine

## 2022-03-19 VITALS — BP 116/69 | HR 49 | Resp 15 | Ht 62.75 in | Wt 200.0 lb

## 2022-03-19 DIAGNOSIS — T783XXD Angioneurotic edema, subsequent encounter: Secondary | ICD-10-CM | POA: Diagnosis not present

## 2022-03-19 DIAGNOSIS — R768 Other specified abnormal immunological findings in serum: Secondary | ICD-10-CM | POA: Diagnosis not present

## 2022-03-20 DIAGNOSIS — E348 Other specified endocrine disorders: Secondary | ICD-10-CM | POA: Diagnosis not present

## 2022-03-22 LAB — ANTI-NUCLEAR AB-TITER (ANA TITER): ANA Titer 1: 1:40 {titer} — ABNORMAL HIGH

## 2022-03-22 LAB — ANA: Anti Nuclear Antibody (ANA): POSITIVE — AB

## 2022-03-22 LAB — RNP ANTIBODY: Ribonucleic Protein(ENA) Antibody, IgG: <1 AI

## 2022-03-22 LAB — SEDIMENTATION RATE: Sed Rate: 2 mm/h (ref 0–20)

## 2022-03-24 ENCOUNTER — Encounter: Payer: Self-pay | Admitting: Internal Medicine

## 2022-03-25 ENCOUNTER — Other Ambulatory Visit: Payer: Self-pay

## 2022-03-25 DIAGNOSIS — I10 Essential (primary) hypertension: Secondary | ICD-10-CM

## 2022-03-25 MED ORDER — AMLODIPINE BESYLATE 5 MG PO TABS: 5.0000 mg | ORAL_TABLET | Freq: Every day | ORAL | 3 refills | Status: DC

## 2022-03-31 ENCOUNTER — Telehealth: Payer: Self-pay | Admitting: Internal Medicine

## 2022-03-31 ENCOUNTER — Other Ambulatory Visit: Payer: Self-pay

## 2022-03-31 ENCOUNTER — Telehealth: Payer: Self-pay | Admitting: Family Medicine

## 2022-03-31 DIAGNOSIS — I10 Essential (primary) hypertension: Secondary | ICD-10-CM

## 2022-03-31 MED ORDER — AMLODIPINE BESYLATE 2.5 MG PO TABS: 2.5000 mg | ORAL_TABLET | Freq: Every day | ORAL | 0 refills | Status: DC

## 2022-03-31 NOTE — Telephone Encounter (Signed)
Patient called the office stating she has labs at her appointment 2 weeks ago. Patient states her labs have resulted in her MyChart and would like a call back with the results. Patient states she also sent a MyChart message.

## 2022-03-31 NOTE — Telephone Encounter (Signed)
Patient states Dr. Lorelei Pont decreased her Amlodipine to 2.5 from the 5 she used to take due to her blood pressure being low, but cutting the pill in hald is not an option because they keep breaking. She would like a new prescription send for 2.5 to her pharmacy. Please advise.    [place] [address] [place] [address] Phone: [phone]  Fax: [phone]

## 2022-03-31 NOTE — Telephone Encounter (Signed)
Rx sent to the pharmacy.

## 2022-04-01 ENCOUNTER — Other Ambulatory Visit: Payer: Self-pay | Admitting: Family Medicine

## 2022-04-01 DIAGNOSIS — M47812 Spondylosis without myelopathy or radiculopathy, cervical region: Secondary | ICD-10-CM

## 2022-04-03 ENCOUNTER — Telehealth: Payer: Self-pay

## 2022-04-03 NOTE — Telephone Encounter (Signed)
PA initiated via Covermymeds; KEY: [id number]. PA approved.   [id number];Review Type:Prior Auth;Coverage Start Date:03/04/2022;Coverage End Date:04/03/2023;

## 2022-04-03 NOTE — Telephone Encounter (Signed)
Approved today CaseId:[id number];Status:Approved;Review Type:Prior Auth;Coverage Start Date:03/04/2022;Coverage End Date:04/03/2023;

## 2022-06-06 ENCOUNTER — Other Ambulatory Visit: Payer: Self-pay | Admitting: Family Medicine

## 2022-06-06 DIAGNOSIS — I10 Essential (primary) hypertension: Secondary | ICD-10-CM

## 2022-06-10 ENCOUNTER — Other Ambulatory Visit: Payer: Self-pay | Admitting: Family Medicine

## 2022-06-18 LAB — HM MAMMOGRAPHY

## 2022-06-26 ENCOUNTER — Other Ambulatory Visit: Payer: Self-pay | Admitting: Family Medicine

## 2022-06-26 DIAGNOSIS — M47812 Spondylosis without myelopathy or radiculopathy, cervical region: Secondary | ICD-10-CM

## 2022-09-27 ENCOUNTER — Other Ambulatory Visit: Payer: Self-pay | Admitting: Family Medicine

## 2022-09-27 DIAGNOSIS — M47812 Spondylosis without myelopathy or radiculopathy, cervical region: Secondary | ICD-10-CM

## 2022-10-01 ENCOUNTER — Ambulatory Visit: Payer: 59 | Admitting: Family

## 2022-10-01 ENCOUNTER — Other Ambulatory Visit (HOSPITAL_BASED_OUTPATIENT_CLINIC_OR_DEPARTMENT_OTHER): Payer: Self-pay

## 2022-10-01 VITALS — BP 136/70 | HR 71 | Temp 99.1°F | Resp 18 | Ht 62.5 in | Wt 199.0 lb

## 2022-10-01 DIAGNOSIS — J020 Streptococcal pharyngitis: Secondary | ICD-10-CM | POA: Diagnosis not present

## 2022-10-01 DIAGNOSIS — J029 Acute pharyngitis, unspecified: Secondary | ICD-10-CM

## 2022-10-01 LAB — POCT RAPID STREP A (OFFICE): Rapid Strep A Screen: POSITIVE — AB

## 2022-10-01 MED ORDER — AZITHROMYCIN 250 MG PO TABS
ORAL_TABLET | ORAL | 0 refills | Status: AC
  Filled 2022-10-01: qty 6, 5d supply, fill #0

## 2022-10-01 MED ORDER — LIDOCAINE VISCOUS HCL 2 % MT SOLN
15.0000 mL | OROMUCOSAL | 0 refills | Status: DC | PRN
  Filled 2022-10-01: qty 100, 1d supply, fill #0

## 2022-10-01 NOTE — Progress Notes (Signed)
Subjective:   By signing my name below, I, Barrett Shell, attest that this documentation has been prepared under the direction and in the presence of Sandford Craze, NP. 10/01/2022   Patient ID: Janet Owens, female    DOB: 08/04/1975, 47 y.o.   MRN: [id number]  Chief Complaint  Patient presents with   Sore Throat    HPI Patient is in today for an office visit.    Sore Throat: She complains of a sore throat and headache for the past two days. She recently traveled on an airplane and thinks that she may have become ill due to that exposure.  She has tried dayquil with mild improvement of symptoms.    Past Medical History:  Diagnosis Date   Angio-edema    Arthritis    Chickenpox    Hemorrhoids    Hyperlipidemia    Hypertension    Urticaria     Past Surgical History:  Procedure Laterality Date   KNEE SURGERY  2002   LAPAROSCOPIC GASTRIC SLEEVE RESECTION      Family History  Problem Relation Age of Onset   Urticaria Mother    Allergic rhinitis Mother    Arthritis Mother    Hyperlipidemia Mother    Hypertension Mother    Cancer Father    COPD Father    Depression Father    Drug abuse Father    Early death Father    Hearing loss Father    Stroke Father    Alcohol abuse Father    Deep vein thrombosis Father    Alcohol abuse Brother    Asthma Brother    Drug abuse Brother    Early death Brother    Alcohol abuse Maternal Grandmother    Hearing loss Maternal Grandmother    Miscarriages / Stillbirths Maternal Grandmother    Heart attack Maternal Grandfather    Hyperlipidemia Maternal Grandfather    Hypertension Maternal Grandfather    Alcohol abuse Paternal Grandmother    Depression Paternal Grandmother    Drug abuse Paternal Grandmother    Heart attack Paternal Grandmother    Hyperlipidemia Paternal Grandmother    Hypertension Paternal Grandmother    Heart disease Paternal Grandmother    Birth defects Paternal Grandfather    Allergies Daughter     Eczema Son     Social History   Socioeconomic History   Marital status: Divorced    Spouse name: Not on file   Number of children: 2   Years of education: Not on file   Highest education level: Some college, no degree  Occupational History   Not on file  Tobacco Use   Smoking status: Former    Packs/day: 0.10    Years: 7.00    Additional pack years: 0.00    Total pack years: 0.70    Types: Cigarettes    Quit date: 2012    Years since quitting: 12.3    Passive exposure: Never   Smokeless tobacco: Never  Vaping Use   Vaping Use: Never used  Substance and Sexual Activity   Alcohol use: Yes    Comment: Once a month   Drug use: Never   Sexual activity: Not on file  Other Topics Concern   Not on file  Social History Narrative   Right Handed   Lives in a two story home    Social Determinants of Health   Financial Resource Strain: Medium Risk (09/30/2022)   Overall Financial Resource Strain (CARDIA)    Difficulty of  Paying Living Expenses: Somewhat hard  Food Insecurity: Food Insecurity Present (09/30/2022)   Hunger Vital Sign    Worried About Running Out of Food in the Last Year: Sometimes true    Ran Out of Food in the Last Year: Never true  Transportation Needs: No Transportation Needs (09/30/2022)   PRAPARE - Administrator, Civil Service (Medical): No    Lack of Transportation (Non-Medical): No  Physical Activity: Unknown (09/30/2022)   Exercise Vital Sign    Days of Exercise per Week: 3 days    Minutes of Exercise per Session: Not on file  Stress: No Stress Concern Present (09/30/2022)   [employer] of Occupational Health - Occupational Stress Questionnaire    Feeling of Stress : Only a little  Social Connections: Socially Isolated (09/30/2022)   Social Connection and Isolation Panel [NHANES]    Frequency of Communication with Friends and Family: More than three times a week    Frequency of Social Gatherings with Friends and Family: Twice a week     Attends Religious Services: Never    Database administrator or Organizations: No    Attends Engineer, structural: Not on file    Marital Status: Divorced  Catering manager Violence: Not on file    Outpatient Medications Prior to Visit  Medication Sig Dispense Refill   amLODipine (NORVASC) 2.5 MG tablet TAKE 1 TABLET DAILY 90 tablet 3   aspirin EC 81 MG tablet Take 81 mg by mouth daily. Swallow whole.     celecoxib (CELEBREX) 200 MG capsule Take 1 capsule (200 mg total) by mouth daily. 30 capsule 0   cetirizine (ZYRTEC) 10 MG tablet Take 10 mg by mouth daily.     Cholecalciferol (VITAMIN D) 50 MCG (2000 UT) CAPS Take 2,000 Units by mouth daily.     Cyanocobalamin (VITAMIN B-12 PO) Take by mouth.     docusate sodium (COLACE) 100 MG capsule      Echinacea 350 MG CAPS      famotidine (PEPCID) 20 MG tablet      Ferrous Sulfate (IRON PO) Take by mouth.     gabapentin (NEURONTIN) 300 MG capsule Take 1 capsule (300 mg total) by mouth at bedtime. 90 capsule 3   Multiple Vitamins-Calcium (ONE-A-DAY WOMENS FORMULA PO) Take by mouth.     rosuvastatin (CRESTOR) 20 MG tablet TAKE 1 TABLET DAILY 90 tablet 3   UNABLE TO FIND Med Name: Mushroom Trip     UNABLE TO FIND One a day Women's Metabolism     FIBER PO Take by mouth daily.     No facility-administered medications prior to visit.    Allergies  Allergen Reactions   Other Hives   Penicillins Hives   Sulfa Antibiotics Hives    Review of Systems  HENT:  Positive for sore throat.   Neurological:  Positive for headaches.       Objective:    Physical Exam Constitutional:      General: She is not in acute distress.    Appearance: Normal appearance.  HENT:     Head: Normocephalic and atraumatic.     Right Ear: Tympanic membrane, ear canal and external ear normal.     Left Ear: Tympanic membrane, ear canal and external ear normal.     Mouth/Throat:     Mouth: Mucous membranes are moist.     Pharynx: Oropharyngeal exudate  (left tonsil) and posterior oropharyngeal erythema present. No pharyngeal swelling or uvula swelling.  Eyes:  Extraocular Movements: Extraocular movements intact.     Pupils: Pupils are equal, round, and reactive to light.  Cardiovascular:     Rate and Rhythm: Normal rate and regular rhythm.     Heart sounds: Normal heart sounds. No murmur heard.    No gallop.  Pulmonary:     Effort: Pulmonary effort is normal. No respiratory distress.     Breath sounds: Normal breath sounds. No wheezing or rales.  Skin:    General: Skin is warm.  Neurological:     Mental Status: She is alert and oriented to person, place, and time.  Psychiatric:        Judgment: Judgment normal.     BP 136/70   Pulse 71   Temp 99.1 F (37.3 C)   Resp 18   Ht 5' 2.5" (1.588 m)   Wt 199 lb (90.3 kg)   LMP 09/14/2022   SpO2 97%   BMI 35.82 kg/m  Wt Readings from Last 3 Encounters:  10/01/22 199 lb (90.3 kg)  03/19/22 200 lb (90.7 kg)  03/12/22 202 lb (91.6 kg)       Assessment & Plan:  Sore throat -     POCT rapid strep A  Strep throat Assessment & Plan: New. Has hx of urticaria with penicillin.  I don't see that she has had any cephalosporin trials in the past in her chart.  Will rx with azithromycin, viscous lidocaine for pain. She is advised to call if symptoms worsen or if symptoms fail to improve. Pt verbalizes understanding.   Orders: -     Azithromycin; Take 2 tablets on day 1, then 1 tablet daily on days 2 through 5  Dispense: 6 tablet; Refill: 0 -     Lidocaine Viscous HCl; Use as directed 15 mLs in the mouth or throat every 4 (four) hours as needed for mouth pain.  Dispense: 100 mL; Refill: 0    I, Lemont Fillers, NP, personally preformed the services described in this documentation.  All medical record entries made by the scribe were at my direction and in my presence.  I have reviewed the chart and discharge instructions (if applicable) and agree that the record reflects my  personal performance and is accurate and complete. 10/01/2022  Lemont Fillers, NP   Mercer Pod as a scribe for Lemont Fillers, NP.,have documented all relevant documentation on the behalf of Lemont Fillers, NP,as directed by  Lemont Fillers, NP while in the presence of Lemont Fillers, NP.

## 2022-10-01 NOTE — Assessment & Plan Note (Addendum)
New. Rapid step is positive. Has hx of urticaria with penicillin.  I don't see that she has had any cephalosporin trials in the past in her chart.  Will rx with azithromycin, viscous lidocaine for pain. She is advised to call if symptoms worsen or if symptoms fail to improve. Pt verbalizes understanding.

## 2022-10-31 ENCOUNTER — Other Ambulatory Visit: Payer: Self-pay | Admitting: Family Medicine

## 2022-10-31 DIAGNOSIS — M47812 Spondylosis without myelopathy or radiculopathy, cervical region: Secondary | ICD-10-CM

## 2022-11-09 NOTE — Progress Notes (Deleted)
[facility]care at [facility] [address] [place], [place] [address] [phone]  Date:  11/12/2022   Name:  Janet Owens   DOB:  [birth year]   MRN:  [id number]  PCP:  Janet Cables, MD    Chief Complaint: No chief complaint on file.   History of Present Illness:  Janet Owens is a 47 y.o. very pleasant female patient who presents with the following:  Patient seen today for physical exam- History of hypertension, dyslipidemia, obesity, B12 deficiency Most recent visit with myself was in October.  At that time she was a few months out from gastric sleeve and was having good results We decreased her dose of amlodipine due to lower blood pressure, probably related to weight loss  Amlodipine 2.5 Aspirin 81 Gabapentin 300 Crestor 20  Pap screening Mammogram up-to-date Reminded that colon cancer screening starts age 14  Labs done in October showed B12 deficiency, normal vitamin D  She was seen by rheumatology, Dr. Sheliah Owens also in October for angioedema/hives and positive ANA Assessment / Plan:     Visit Diagnoses: Positive ANA (antinuclear antibody) - Plan: ANA, RNP Antibody, Sedimentation rate   Low positive RNP still no raynaud's, no pulmonary symptoms, no inflammatory arthritis, no weakness. Will recheck ANA titer and RNP as well as sedimentation rate test today. With no symptoms I would not recommend empiric DMARD treatment even if positive results but could do so if symptoms return. If labs equal or lesser suspect that was more reactive or self limited finding and would not need f/u. Angioedema, subsequent encounter No recurrence of symptoms since our last visit, remains on oral antihistamines cetirizine and famotidine at this time. If lab findings are unremarkable on repeat today would recommend any recurrence of symptoms likely best managed with allergy/immunology office follow up.   Patient Active Problem  List   Diagnosis Date Noted   Strep throat 10/01/2022   Skin tag 09/11/2021   Positive ANA (antinuclear antibody) 09/11/2021   Obstructive sleep apnea 06/21/2021   Urticaria 04/11/2021   Angio-edema 04/11/2021   Vitamin D deficiency 01/12/2021   Onychomycosis due to dermatophyte 12/12/2019   Essential hypertension 09/05/2019   Hyperlipidemia 09/05/2019   Class 3 severe obesity without serious comorbidity with body mass index (BMI) of 40.0 to 44.9 in adult [facility]) 09/05/2019   Spondylosis of cervical region without myelopathy or radiculopathy 09/05/2019    Past Medical History:  Diagnosis Date   Angio-edema    Arthritis    Chickenpox    Hemorrhoids    Hyperlipidemia    Hypertension    Urticaria     Past Surgical History:  Procedure Laterality Date   KNEE SURGERY  2002   LAPAROSCOPIC GASTRIC SLEEVE RESECTION      Social History   Tobacco Use   Smoking status: Former    Packs/day: 0.10    Years: 7.00    Additional pack years: 0.00    Total pack years: 0.70    Types: Cigarettes    Quit date: 2012    Years since quitting: 12.4    Passive exposure: Never   Smokeless tobacco: Never  Vaping Use   Vaping Use: Never used  Substance Use Topics   Alcohol use: Yes    Comment: Once a month   Drug use: Never    Family History  Problem Relation Age of Onset   Urticaria Mother    Allergic rhinitis Mother    Arthritis  Mother    Hyperlipidemia Mother    Hypertension Mother    Cancer Father    COPD Father    Depression Father    Drug abuse Father    Early death Father    Hearing loss Father    Stroke Father    Alcohol abuse Father    Deep vein thrombosis Father    Alcohol abuse Brother    Asthma Brother    Drug abuse Brother    Early death Brother    Alcohol abuse Maternal Grandmother    Hearing loss Maternal Grandmother    Miscarriages / Stillbirths Maternal Grandmother    Heart attack Maternal Grandfather    Hyperlipidemia Maternal Grandfather     Hypertension Maternal Grandfather    Alcohol abuse Paternal Grandmother    Depression Paternal Grandmother    Drug abuse Paternal Grandmother    Heart attack Paternal Grandmother    Hyperlipidemia Paternal Grandmother    Hypertension Paternal Grandmother    Heart disease Paternal Grandmother    Birth defects Paternal Grandfather    Allergies Daughter    Eczema Son     Allergies  Allergen Reactions   Other Hives   Penicillins Hives   Sulfa Antibiotics Hives    Medication list has been reviewed and updated.  Current Outpatient Medications on File Prior to Visit  Medication Sig Dispense Refill   amLODipine (NORVASC) 2.5 MG tablet TAKE 1 TABLET DAILY 90 tablet 3   aspirin EC 81 MG tablet Take 81 mg by mouth daily. Swallow whole.     celecoxib (CELEBREX) 200 MG capsule Take 1 capsule (200 mg total) by mouth daily. 30 capsule 0   cetirizine (ZYRTEC) 10 MG tablet Take 10 mg by mouth daily.     Cholecalciferol (VITAMIN D) 50 MCG (2000 UT) CAPS Take 2,000 Units by mouth daily.     Cyanocobalamin (VITAMIN B-12 PO) Take by mouth.     docusate sodium (COLACE) 100 MG capsule      Echinacea 350 MG CAPS      famotidine (PEPCID) 20 MG tablet      Ferrous Sulfate (IRON PO) Take by mouth.     gabapentin (NEURONTIN) 300 MG capsule Take 1 capsule (300 mg total) by mouth at bedtime. 90 capsule 3   lidocaine (XYLOCAINE) 2 % solution Use as directed 15 mLs in the mouth or throat every 4 (four) hours as needed for mouth pain. 100 mL 0   Multiple Vitamins-Calcium (ONE-A-DAY WOMENS FORMULA PO) Take by mouth.     rosuvastatin (CRESTOR) 20 MG tablet TAKE 1 TABLET DAILY 90 tablet 3   UNABLE TO FIND Med Name: Mushroom Trip     UNABLE TO FIND One a day Women's Metabolism     No current facility-administered medications on file prior to visit.    Review of Systems:  As per HPI- otherwise negative.   Physical Examination: There were no vitals filed for this visit. There were no vitals filed for  this visit. There is no height or weight on file to calculate BMI. Ideal Body Weight:    GEN: no acute distress. HEENT: Atraumatic, Normocephalic.  Ears and Nose: No external deformity. CV: RRR, No M/G/R. No JVD. No thrill. No extra heart sounds. PULM: CTA B, no wheezes, crackles, rhonchi. No retractions. No resp. distress. No accessory muscle use. ABD: S, NT, ND, +BS. No rebound. No HSM. EXTR: No c/c/e PSYCH: Normally interactive. Conversant.    Assessment and Plan: ***  Signed Abbe Amsterdam, MD

## 2022-11-11 ENCOUNTER — Encounter: Payer: Self-pay | Admitting: Family Medicine

## 2022-11-12 ENCOUNTER — Encounter: Payer: 59 | Admitting: Family Medicine

## 2022-11-23 NOTE — Progress Notes (Unsigned)
[facility] [address] [place], [place] [address] [phone]  Date:  11/24/2022   Name:  Janet Owens   DOB:  [birth year]   MRN:  [id number]  PCP:  Pearline Cables, MD    Chief Complaint: No chief complaint on file.   History of Present Illness:  Janet Owens is a 47 y.o. very pleasant female patient who presents with the following:  Patient seen today for physical exam and Pap smear Most recent visit with myself was in October History of hypertension, dyslipidemia, obesity, status post gastric sleeve surgery  Pap smear March 2021, update today Mammogram is up-to-date  She was seen by Dr. Allena Katz last fall for concern of headaches and intracranial stenosis: IMPRESSION/PLAN: Right arm paresthesias contributing be carpal tunnel syndrome - Encouraged to wear a wrist brace at nighttime - Continue gabapentin 300mg  at bedtime 2. Intracranial stenosis with bilateral M2 stenosis, worse on the right. Asymptomatic. MRI brain did not show acute stroke.  She has known 7mm pineal cyst.  She has been on dual antiplatelet therapy, now on aspirin 81mg .  Repeat imaging was performed yesterday by Dr. Conchita Paris, I do not have these images to review.               - LDL significantly imporved 168 >> 53 on statin therapy              - Continue Crestor 20mg  daily              - Continue aspirin 81mg  daily              - BP was mildly elevated today, but improved on recheck.  Advised to follow BP at home and see PCP if it remains elevated 3. Headache associated with sexual activity, improved              - Previously offered propranolol, which she only used once              - Avoid triptans with known intracranial stenosis Return to clinic in 1 year  She also saw rheumatology, Dr. Dimple Casey last fall-he felt her issues were more allergic and did not recommend rheumatology follow-up Patient Active Problem List   Diagnosis Date  Noted   Strep throat 10/01/2022   Skin tag 09/11/2021   Positive ANA (antinuclear antibody) 09/11/2021   Obstructive sleep apnea 06/21/2021   Urticaria 04/11/2021   Angio-edema 04/11/2021   Vitamin D deficiency 01/12/2021   Onychomycosis due to dermatophyte 12/12/2019   Essential hypertension 09/05/2019   Hyperlipidemia 09/05/2019   Class 3 severe obesity without serious comorbidity with body mass index (BMI) of 40.0 to 44.9 in adult [facility]) 09/05/2019   Spondylosis of cervical region without myelopathy or radiculopathy 09/05/2019    Past Medical History:  Diagnosis Date   Angio-edema    Arthritis    Chickenpox    Hemorrhoids    Hyperlipidemia    Hypertension    Urticaria     Past Surgical History:  Procedure Laterality Date   KNEE SURGERY  2002   LAPAROSCOPIC GASTRIC SLEEVE RESECTION      Social History   Tobacco Use   Smoking status: Former    Packs/day: 0.10    Years: 7.00    Additional pack years: 0.00    Total pack years: 0.70    Types: Cigarettes    Quit date: 2012    Years since quitting: 12.5  Passive exposure: Never   Smokeless tobacco: Never  Vaping Use   Vaping Use: Never used  Substance Use Topics   Alcohol use: Yes    Comment: Once a month   Drug use: Never    Family History  Problem Relation Age of Onset   Urticaria Mother    Allergic rhinitis Mother    Arthritis Mother    Hyperlipidemia Mother    Hypertension Mother    Cancer Father    COPD Father    Depression Father    Drug abuse Father    Early death Father    Hearing loss Father    Stroke Father    Alcohol abuse Father    Deep vein thrombosis Father    Alcohol abuse Brother    Asthma Brother    Drug abuse Brother    Early death Brother    Alcohol abuse Maternal Grandmother    Hearing loss Maternal Grandmother    Miscarriages / Stillbirths Maternal Grandmother    Heart attack Maternal Grandfather    Hyperlipidemia Maternal Grandfather    Hypertension Maternal Grandfather     Alcohol abuse Paternal Grandmother    Depression Paternal Grandmother    Drug abuse Paternal Grandmother    Heart attack Paternal Grandmother    Hyperlipidemia Paternal Grandmother    Hypertension Paternal Grandmother    Heart disease Paternal Grandmother    Birth defects Paternal Grandfather    Allergies Daughter    Eczema Son     Allergies  Allergen Reactions   Other Hives   Penicillins Hives   Sulfa Antibiotics Hives    Medication list has been reviewed and updated.  Current Outpatient Medications on File Prior to Visit  Medication Sig Dispense Refill   amLODipine (NORVASC) 2.5 MG tablet TAKE 1 TABLET DAILY 90 tablet 3   aspirin EC 81 MG tablet Take 81 mg by mouth daily. Swallow whole.     celecoxib (CELEBREX) 200 MG capsule Take 1 capsule (200 mg total) by mouth daily. 30 capsule 0   cetirizine (ZYRTEC) 10 MG tablet Take 10 mg by mouth daily.     Cholecalciferol (VITAMIN D) 50 MCG (2000 UT) CAPS Take 2,000 Units by mouth daily.     Cyanocobalamin (VITAMIN B-12 PO) Take by mouth.     docusate sodium (COLACE) 100 MG capsule      Echinacea 350 MG CAPS      famotidine (PEPCID) 20 MG tablet      Ferrous Sulfate (IRON PO) Take by mouth.     gabapentin (NEURONTIN) 300 MG capsule Take 1 capsule (300 mg total) by mouth at bedtime. 90 capsule 3   lidocaine (XYLOCAINE) 2 % solution Use as directed 15 mLs in the mouth or throat every 4 (four) hours as needed for mouth pain. 100 mL 0   Multiple Vitamins-Calcium (ONE-A-DAY WOMENS FORMULA PO) Take by mouth.     rosuvastatin (CRESTOR) 20 MG tablet TAKE 1 TABLET DAILY 90 tablet 3   UNABLE TO FIND Med Name: Mushroom Trip     UNABLE TO FIND One a day Women's Metabolism     No current facility-administered medications on file prior to visit.    Review of Systems:  As per HPI- otherwise negative.   Physical Examination: There were no vitals filed for this visit. There were no vitals filed for this visit. There is no height or  weight on file to calculate BMI. Ideal Body Weight:    GEN: no acute distress. HEENT: Atraumatic, Normocephalic.  Ears and Nose: No external deformity. CV: RRR, No M/G/R. No JVD. No thrill. No extra heart sounds. PULM: CTA B, no wheezes, crackles, rhonchi. No retractions. No resp. distress. No accessory muscle use. ABD: S, NT, ND, +BS. No rebound. No HSM. EXTR: No c/c/e PSYCH: Normally interactive. Conversant.    Assessment and Plan: *** Physical exam today.  Encouraged healthy diet and exercise routine  Will plan further follow- up pending labs.  Signed Abbe Amsterdam, MD

## 2022-11-23 NOTE — Patient Instructions (Signed)
It was good to see again today, I will be in touch with your lab results  Mammogram is up-to-date  We can talk about colon cancer screening at your next visit.  Assuming no history of close relatives with colon cancer specifically, you can do either a colonoscopy or the at home Cologuard test, per your preference

## 2022-11-24 ENCOUNTER — Other Ambulatory Visit (HOSPITAL_COMMUNITY): Admission: RE | Admit: 2022-11-24 | Payer: 59 | Source: Ambulatory Visit

## 2022-11-24 ENCOUNTER — Ambulatory Visit (INDEPENDENT_AMBULATORY_CARE_PROVIDER_SITE_OTHER): Payer: 59 | Admitting: Family Medicine

## 2022-11-24 VITALS — BP 122/70 | HR 72 | Temp 97.7°F | Resp 18 | Ht 62.0 in | Wt 197.8 lb

## 2022-11-24 DIAGNOSIS — Z1329 Encounter for screening for other suspected endocrine disorder: Secondary | ICD-10-CM | POA: Diagnosis not present

## 2022-11-24 DIAGNOSIS — Z131 Encounter for screening for diabetes mellitus: Secondary | ICD-10-CM | POA: Diagnosis not present

## 2022-11-24 DIAGNOSIS — Z8349 Family history of other endocrine, nutritional and metabolic diseases: Secondary | ICD-10-CM

## 2022-11-24 DIAGNOSIS — E559 Vitamin D deficiency, unspecified: Secondary | ICD-10-CM | POA: Diagnosis not present

## 2022-11-24 DIAGNOSIS — Z124 Encounter for screening for malignant neoplasm of cervix: Secondary | ICD-10-CM | POA: Insufficient documentation

## 2022-11-24 DIAGNOSIS — E782 Mixed hyperlipidemia: Secondary | ICD-10-CM | POA: Diagnosis not present

## 2022-11-24 DIAGNOSIS — Z0000 Encounter for general adult medical examination without abnormal findings: Secondary | ICD-10-CM

## 2022-11-24 DIAGNOSIS — Z903 Acquired absence of stomach [part of]: Secondary | ICD-10-CM

## 2022-11-24 DIAGNOSIS — M47812 Spondylosis without myelopathy or radiculopathy, cervical region: Secondary | ICD-10-CM

## 2022-11-24 DIAGNOSIS — I10 Essential (primary) hypertension: Secondary | ICD-10-CM

## 2022-11-24 MED ORDER — CELECOXIB 200 MG PO CAPS: 200.0000 mg | ORAL_CAPSULE | Freq: Every day | ORAL | 3 refills | Status: DC

## 2022-11-25 ENCOUNTER — Encounter: Payer: Self-pay | Admitting: Family Medicine

## 2022-11-25 LAB — LIPID PANEL
Cholesterol: 125 mg/dL (ref 0–200)
HDL: 54 mg/dL (ref >39.00)
LDL Cholesterol: 59 mg/dL (ref 0–99)
NonHDL: 71.46
Total CHOL/HDL Ratio: 2
Triglycerides: 63 mg/dL (ref 0.0–149.0)
VLDL: 12.6 mg/dL (ref 0.0–40.0)

## 2022-11-25 LAB — COMPREHENSIVE METABOLIC PANEL
ALT: 26 U/L (ref 0–35)
AST: 21 U/L (ref 0–37)
Albumin: 4.4 g/dL (ref 3.5–5.2)
Alkaline Phosphatase: 43 U/L (ref 39–117)
BUN: 15 mg/dL (ref 6–23)
CO2: 27 mEq/L (ref 19–32)
Calcium: 10.1 mg/dL (ref 8.4–10.5)
Chloride: 100 mEq/L (ref 96–112)
Creatinine, Ser: 0.76 mg/dL (ref 0.40–1.20)
GFR: 95.14 mL/min (ref >60.00)
Glucose, Bld: 82 mg/dL (ref 70–99)
Potassium: 4.2 mEq/L (ref 3.5–5.1)
Sodium: 137 mEq/L (ref 135–145)
Total Bilirubin: 0.6 mg/dL (ref 0.2–1.2)
Total Protein: 7.3 g/dL (ref 6.0–8.3)

## 2022-11-25 LAB — VITAMIN D 25 HYDROXY (VIT D DEFICIENCY, FRACTURES): VITD: 32.35 ng/mL (ref 30.00–100.00)

## 2022-11-25 LAB — CBC
HCT: 40.8 % (ref 36.0–46.0)
Hemoglobin: 13.4 g/dL (ref 12.0–15.0)
MCHC: 33 g/dL (ref 30.0–36.0)
MCV: 92.6 fl (ref 78.0–100.0)
Platelets: 305 10*3/uL (ref 150.0–400.0)
RBC: 4.4 Mil/uL (ref 3.87–5.11)
RDW: 13.4 % (ref 11.5–15.5)
WBC: 8 10*3/uL (ref 4.0–10.5)

## 2022-11-25 LAB — HEMOGLOBIN A1C: Hgb A1c MFr Bld: 5.2 % (ref 4.6–6.5)

## 2022-11-25 LAB — FERRITIN: Ferritin: 31 ng/mL (ref 10.0–291.0)

## 2022-11-25 LAB — TSH: TSH: 1.77 u[IU]/mL (ref 0.35–5.50)

## 2022-11-25 LAB — VITAMIN B12: Vitamin B-12: 854 pg/mL (ref 211–911)

## 2022-11-26 ENCOUNTER — Encounter: Payer: Self-pay | Admitting: Family Medicine

## 2022-11-26 LAB — CYTOLOGY - PAP
Comment: NEGATIVE
Diagnosis: UNDETERMINED — AB
High risk HPV: NEGATIVE

## 2023-02-26 ENCOUNTER — Telehealth: Payer: Self-pay | Admitting: *Deleted

## 2023-02-26 NOTE — Telephone Encounter (Signed)
Left patient a message to call the office to give and receive information prior to appointment.

## 2023-03-04 DIAGNOSIS — Z8632 Personal history of gestational diabetes: Secondary | ICD-10-CM | POA: Insufficient documentation

## 2023-03-04 NOTE — Progress Notes (Unsigned)
   GYNECOLOGY OFFICE VISIT NOTE  History:   Janet Owens is a 47 y.o. No obstetric history on file. here today for IUD removal. It was placed ***.       The following portions of the patient's history were reviewed and updated as appropriate: allergies, current medications, past family history, past medical history, past social history, past surgical history and problem list.   Review of Systems:  Pertinent items noted in HPI and remainder of comprehensive ROS otherwise negative.  Physical Exam:  There were no vitals taken for this visit. CONSTITUTIONAL: Well-developed, well-nourished female in no acute distress.  HEENT:  Normocephalic, atraumatic. External right and left ear normal. No scleral icterus.  NECK: Normal range of motion, supple, no masses noted on observation SKIN: No rash noted. Not diaphoretic. No erythema. No pallor. MUSCULOSKELETAL: Normal range of motion. No edema noted. NEUROLOGIC: Alert and oriented to person, place, and time. Normal muscle tone coordination. No cranial nerve deficit noted. PSYCHIATRIC: Normal mood and affect. Normal behavior. Normal judgment and thought content.  PELVIC: {Blank single:19197::"Deferred","Normal appearing external genitalia; normal urethral meatus; normal appearing vaginal mucosa and cervix.  No abnormal discharge noted.  Normal uterine size, no other palpable masses, no uterine or adnexal tenderness. Performed in the presence of a chaperone"}  Labs and Imaging No results found for this or any previous visit (from the past 168 hour(s)). No results found.  Assessment and Plan:   1. Encounter for IUD removal ***  2. History of gestational diabetes A1C normal this year.     Diagnoses and all orders for this visit:  Encounter for IUD removal  History of gestational diabetes     No orders of the defined types were placed in this encounter.    Routine preventative health maintenance measures emphasized. Please refer  to After Visit Summary for other counseling recommendations.   No follow-ups on file.  Milas Hock, MD, FACOG Obstetrician & Gynecologist, [facility] for [facility], [facility]

## 2023-03-05 ENCOUNTER — Ambulatory Visit: Payer: 59 | Admitting: Obstetrics and Gynecology

## 2023-03-05 ENCOUNTER — Encounter: Payer: Self-pay | Admitting: Obstetrics and Gynecology

## 2023-03-05 VITALS — BP 134/78 | HR 56 | Ht 62.0 in | Wt 183.0 lb

## 2023-03-05 DIAGNOSIS — Z903 Acquired absence of stomach [part of]: Secondary | ICD-10-CM

## 2023-03-05 DIAGNOSIS — Z8632 Personal history of gestational diabetes: Secondary | ICD-10-CM

## 2023-03-05 DIAGNOSIS — Z30432 Encounter for removal of intrauterine contraceptive device: Secondary | ICD-10-CM

## 2023-03-05 DIAGNOSIS — Z3169 Encounter for other general counseling and advice on procreation: Secondary | ICD-10-CM

## 2023-03-09 LAB — CALCIUM: Calcium: 9.6 mg/dL (ref 8.7–10.2)

## 2023-03-09 LAB — IRON: Iron: 63 ug/dL (ref 27–159)

## 2023-03-09 LAB — VITAMIN B1: Thiamine: 123.7 nmol/L (ref 66.5–200.0)

## 2023-03-09 LAB — FOLATE: Folate: 17.5 ng/mL (ref >3.0)

## 2023-03-18 ENCOUNTER — Ambulatory Visit: Payer: BLUE CROSS/BLUE SHIELD | Admitting: Neurology

## 2023-06-29 ENCOUNTER — Other Ambulatory Visit: Payer: Self-pay | Admitting: Family Medicine

## 2023-06-29 DIAGNOSIS — I10 Essential (primary) hypertension: Secondary | ICD-10-CM

## 2023-06-29 LAB — HM MAMMOGRAPHY

## 2023-06-29 MED ORDER — AMLODIPINE BESYLATE 2.5 MG PO TABS: 2.5000 mg | ORAL_TABLET | Freq: Every day | ORAL | 3 refills | Status: DC

## 2023-06-29 NOTE — Telephone Encounter (Signed)
Copied from CRM [id number]. Topic: Clinical - Prescription Issue >> Jun 29, 2023 11:07 AM Chantha C wrote: Reason for CRM: Error message of medication refill, patient states new insurance has to be [facility] instead of [facility]. Please refill amLODipine (NORVASC) 2.5 MG tablet to [facility] [place], [place] - [facility] AT Portal to Registered [address] Sites [address] [place] [address] [phone]:[phone]

## 2023-06-29 NOTE — Telephone Encounter (Signed)
Copied from CRM [id number]. Topic: Clinical - Medication Refill >> Jun 29, 2023 10:56 AM Orinda Kenner C wrote: Most Recent Primary Care Visit:  Provider: Abbe Amsterdam C  Department: [facility]  Visit Type: PHYSICAL  Date: 11/24/2022  Medication: amLODipine (NORVASC) 2.5 MG tablet   Has the patient contacted their pharmacy? Yes (Agent: If no, request that the patient contact the pharmacy for the refill. If patient does not wish to contact the pharmacy document the reason why and proceed with request.) (Agent: If yes, when and what did the pharmacy advise?)  Is this the correct pharmacy for this prescription? Yes If no, delete pharmacy and type the correct one.  This is the patient's preferred pharmacy:   [facility] - [place], [place] - [address] [place] [address] Phone: [phone] Fax: [phone]   Has the prescription been filled recently? No  Is the patient out of the medication? No, running low. Patient new insurance has to use [facility].   Has the patient been seen for an appointment in the last year OR does the patient have an upcoming appointment? Yes  Can we respond through MyChart? Yes  Agent: Please be advised that Rx refills may take up to 3 business days. We ask that you follow-up with your pharmacy.

## 2023-09-21 ENCOUNTER — Other Ambulatory Visit: Payer: Self-pay | Admitting: Family Medicine

## 2023-09-21 DIAGNOSIS — I10 Essential (primary) hypertension: Secondary | ICD-10-CM

## 2023-09-21 NOTE — Telephone Encounter (Signed)
 Copied from CRM [id number]. Topic: Clinical - Medication Refill >> Sep 21, 2023 10:40 AM Lizabeth Riggs wrote: Most Recent Primary Care Visit:  Provider: Kaylee Partridge  Department: [facility]  Visit Type: PHYSICAL  Date: 11/24/2022  Medication: amLODipine  (NORVASC ) 2.5 MG tablet  Has the patient contacted their pharmacy? Yes (Agent: If no, request that the patient contact the pharmacy for the refill. If patient does not wish to contact the pharmacy document the reason why and proceed with request.) (Agent: If yes, when and what did the pharmacy advise?) Pharmacy needs an order to refill  Is this the correct pharmacy for this prescription? Yes If no, delete pharmacy and type the correct one.  This is the patient's preferred pharmacy:  [facility] - [place], [place] - One [facility] AT Portal to Registered [employer] Sites One [place] [address] Phone: [phone] Fax: [phone]  Has the prescription been filled recently? No  Is the patient out of the medication? No  Has the patient been seen for an appointment in the last year OR does the patient have an upcoming appointment? Yes  Can we respond through MyChart? Yes  Agent: Please be advised that Rx refills may take up to 3 business days. We ask that you follow-up with your pharmacy.

## 2023-09-22 NOTE — Telephone Encounter (Signed)
 Has refills left

## 2023-09-30 ENCOUNTER — Ambulatory Visit: Payer: Self-pay

## 2023-09-30 ENCOUNTER — Telehealth: Admitting: Family Medicine

## 2023-09-30 VITALS — Ht 62.0 in

## 2023-09-30 DIAGNOSIS — R300 Dysuria: Secondary | ICD-10-CM

## 2023-09-30 MED ORDER — NITROFURANTOIN MONOHYD MACRO 100 MG PO CAPS
100.0000 mg | ORAL_CAPSULE | Freq: Two times a day (BID) | ORAL | 0 refills | Status: AC
Start: 2023-09-30 — End: ?

## 2023-09-30 NOTE — Progress Notes (Signed)
 [facility] [address] [place], [place] [address] [phone]  Date:  09/30/2023   Name:  Janet Owens   DOB:  1975-06-30   MRN:  [id number]  PCP:  Kaylee Partridge, MD    Chief Complaint: Urinary Tract Infection (Burning, urgency and frequency /"A bit of blood today"/Onset 09/26/2023)   History of Present Illness:  Janet Owens is a 48 y.o. very pleasant female patient who presents with the following:  Connected with pt via mychart video today for concern of possible UTI- she is at home and I am at office Connect with patient via MyChart video, confirmed identity with 2 factors.  The patient and myself are present on the call today, she gives her consent for a virtual visit  Today is Wednesday- she noted start of sx on Saturday with urgency and frequency, dysuria She is treating with azo and hydration, cranberry  She saw a small amount of blood in her urine today so she thought that she should call No fever or chills No abd, back pain or vomiting; she notes otherwise she feels well  She does get UTI and this seems like her typical symptoms- maybe last year was most recent  She notes no possibility of current pregnancy  Patient Active Problem List   Diagnosis Date Noted   H/O gastric sleeve 03/05/2023   History of gestational diabetes 03/04/2023   Positive ANA (antinuclear antibody) 09/11/2021   Obstructive sleep apnea 06/21/2021   Urticaria 04/11/2021   Angio-edema 04/11/2021   Vitamin D  deficiency 01/12/2021   Onychomycosis due to dermatophyte 12/12/2019   Essential hypertension 09/05/2019   Hyperlipidemia 09/05/2019   Obesity (BMI 30.0-34.9) 09/05/2019   Spondylosis of cervical region without myelopathy or radiculopathy 09/05/2019    Past Medical History:  Diagnosis Date   Angio-edema    Arthritis    Chickenpox    Hemorrhoids    Hyperlipidemia    Hypertension    Urticaria     Past  Surgical History:  Procedure Laterality Date   KNEE SURGERY  2002   LAPAROSCOPIC GASTRIC SLEEVE RESECTION      Social History   Tobacco Use   Smoking status: Former    Current packs/day: 0.00    Average packs/day: 0.1 packs/day for 7.0 years (0.7 ttl pk-yrs)    Types: Cigarettes    Start date: 2005    Quit date: 2012    Years since quitting: 13.3    Passive exposure: Never   Smokeless tobacco: Never  Vaping Use   Vaping status: Never Used  Substance Use Topics   Alcohol use: Yes    Comment: Once a month   Drug use: Never    Family History  Problem Relation Age of Onset   Urticaria Mother    Allergic rhinitis Mother    Arthritis Mother    Hyperlipidemia Mother    Hypertension Mother    Cancer Father    COPD Father    Depression Father    Drug abuse Father    Early death Father    Hearing loss Father    Stroke Father    Alcohol abuse Father    Deep vein thrombosis Father    Alcohol abuse Brother    Asthma Brother    Drug abuse Brother    Early death Brother    Alcohol abuse Maternal Grandmother    Hearing loss Maternal Grandmother    Miscarriages / Stillbirths Maternal  Grandmother    Heart attack Maternal Grandfather    Hyperlipidemia Maternal Grandfather    Hypertension Maternal Grandfather    Alcohol abuse Paternal Grandmother    Depression Paternal Grandmother    Drug abuse Paternal Grandmother    Heart attack Paternal Grandmother    Hyperlipidemia Paternal Grandmother    Hypertension Paternal Grandmother    Heart disease Paternal Grandmother    Birth defects Paternal Grandfather    Allergies Daughter    Eczema Son     Allergies  Allergen Reactions   Other Hives   Penicillins Hives   Sulfa Antibiotics Hives    Medication list has been reviewed and updated.  Current Outpatient Medications on File Prior to Visit  Medication Sig Dispense Refill   amLODipine  (NORVASC ) 2.5 MG tablet Take 1 tablet (2.5 mg total) by mouth daily. 90 tablet 3    aspirin EC 81 MG tablet Take 81 mg by mouth daily. Swallow whole.     cetirizine (ZYRTEC) 10 MG tablet Take 10 mg by mouth daily.     Cholecalciferol (VITAMIN D ) 50 MCG (2000 UT) CAPS Take 2,000 Units by mouth daily.     Cyanocobalamin  (VITAMIN B-12 PO) Take by mouth.     docusate sodium (COLACE) 100 MG capsule      famotidine  (PEPCID ) 20 MG tablet      Ferrous Sulfate (IRON PO) Take by mouth.     UNABLE TO FIND Med Name: Mushroom Trip     UNABLE TO FIND One a day Women's Metabolism     Echinacea 350 MG CAPS      gabapentin  (NEURONTIN ) 300 MG capsule Take 1 capsule (300 mg total) by mouth at bedtime. 90 capsule 3   rosuvastatin  (CRESTOR ) 20 MG tablet TAKE 1 TABLET DAILY 90 tablet 3   No current facility-administered medications on file prior to visit.    Review of Systems:  As per HPI- otherwise negative.   Physical Examination: There were no vitals filed for this visit. Vitals:   09/30/23 1431  Height: 5\' 2"  (1.575 m)   Body mass index is 33.47 kg/m. Ideal Body Weight: Weight in (lb) to have BMI = 25: 136.4 Patient is her via MyChart video.  She looks well, her normal self.  No shortness of breath or distress is noted   Assessment and Plan: Dysuria - Plan: nitrofurantoin, macrocrystal-monohydrate, (MACROBID) 100 MG capsule  Virtual visit today for concern of dysuria and likely urinary tract infection.  Advised patient that ideally we would like her to come in and give a urine sample for culture-this can be important in cases of antibiotic resistance.  However at this time it is very difficult for her to get away and come in to leave a urine sample, she declines to come into lab.  In this case we will prescribe Macrobid 100 twice daily, advised patient she should be improving rapidly.  If she does not see improvement within 24 to 48 hours please let me know  Signed Gates Kasal, MD

## 2023-09-30 NOTE — Telephone Encounter (Signed)
Appt scheduled later today.

## 2023-09-30 NOTE — Telephone Encounter (Signed)
 Copied from CRM [id number]. Topic: Clinical - Red Word Triage >> Sep 30, 2023 11:29 AM Janet Owens wrote: [facility] that prompted transfer to Nurse Triage: Patient called requesting a same-day appointment for today due to symptoms of a UTI. She has been self-treating with Azo, cranberry juice, and increased water intake, but is now noticing blood in her urine. Patient is requesting if her PCP can prescribe medication for her symptoms.   Chief Complaint: Urinary Symptoms  Symptoms: Hematuria, Frequency, Urgency  Frequency: Since this past Saturday  Pertinent Negatives: Patient denies fever, flank pain  Disposition: [] ED /[] Urgent Care (no appt availability in office) / [x] Appointment(In office/virtual)/ []  [facility]/ [] Home Care/ [] Refused Recommended Disposition /[]  Mobile Bus/ []  Follow-up with PCP Additional Notes: ST is being triaged for what she believes is a urinary tract infection. Virtual appointment made for today per patient's schedule and symptoms.   Reason for Disposition  Urinating more frequently than usual (i.e., frequency)  Answer Assessment - Initial Assessment Questions 1. SYMPTOM: "What's the main symptom you're concerned about?" (e.g., frequency, incontinence)     Frequency, Urgency, Hematuria  2. ONSET: "When did the  symptoms  start?"      This past Saturday  3. PAIN: "Is there any pain?" If Yes, ask: "How bad is it?" (Scale: 1-10; mild, moderate, severe)     *No Answer* 4. CAUSE: "What do you think is causing the symptoms?"     *No Answer* 5. OTHER SYMPTOMS: "Do you have any other symptoms?" (e.g., blood in urine, fever, flank pain, pain with urination)     Blood in urine,  6. PREGNANCY: "Is there any chance you are pregnant?" "When was your last menstrual period?"     No and no  Protocols used: Urinary Symptoms-A-AH

## 2023-10-01 ENCOUNTER — Encounter: Payer: Self-pay | Admitting: Family Medicine

## 2023-10-22 IMAGING — MR MR HEAD W/O CM
11 series · 48 of 48 positions shown · non-contrast
Comparison: CTA head-neck 02/08/2021.

CLINICAL DATA: Transient ischemic attack. Intracranial vascular
stenosis. Headache associated with sexual activity.

EXAM:
MRI HEAD WITHOUT CONTRAST
TECHNIQUE: Multiplanar, multiecho pulse sequences of the brain and surrounding
structures were obtained without intravenous contrast.

[Series 5: T1 · sagittal · 4.0mm · 0.75mm/px · 1 of 31 slices shown (1 of 2)]
[im 1/31]
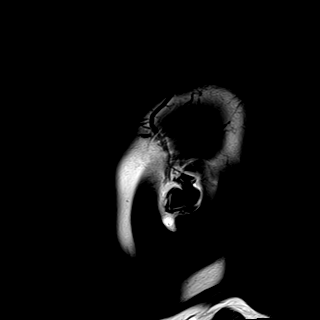

[Series 6: DWI · axial · 3.0mm · 0.94mm/px · z∈[-111,+50]mm · 10 of 184 slices shown (1 of 3)]
[im 1/184]
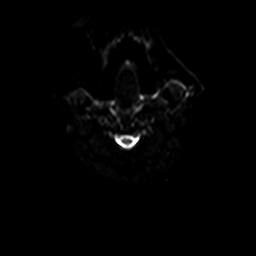
[im 21/184]
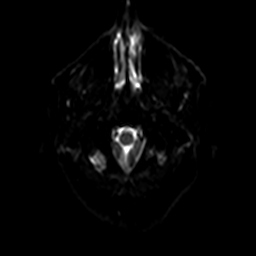
[im 41/184]
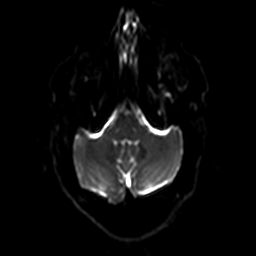
[im 62/184]
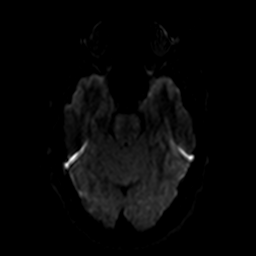
[im 82/184]
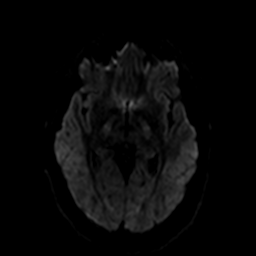
[im 102/184]
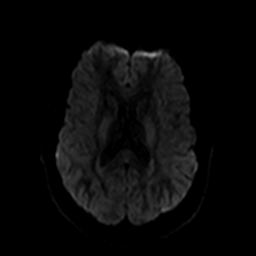
[im 123/184]
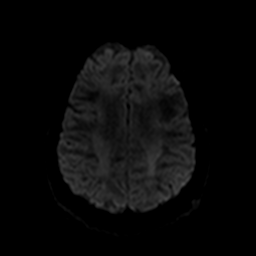
[im 143/184]
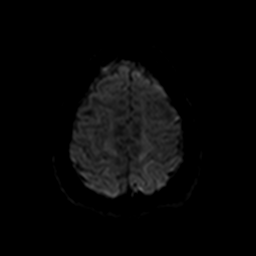
[im 163/184]
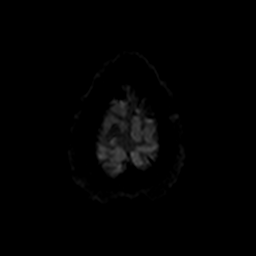
[im 184/184]
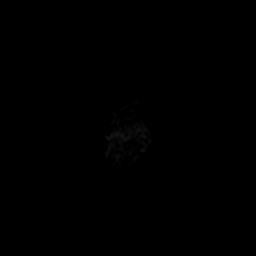

[Series 7: ax dwi_tracew · axial · 3.0mm · 0.94mm/px · z∈[-111,+50]mm · 6 of 92 slices shown]
[im 1/92]
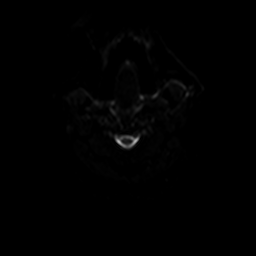
[im 19/92]
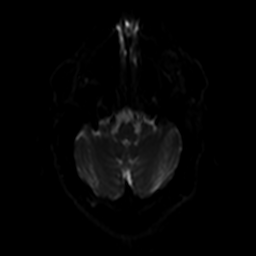
[im 37/92]
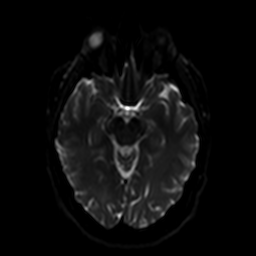
[im 55/92]
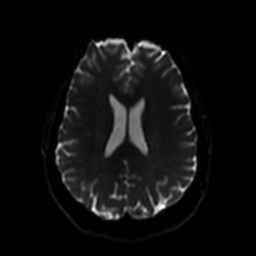
[im 73/92]
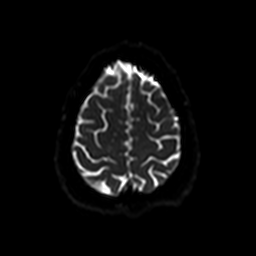
[im 92/92]
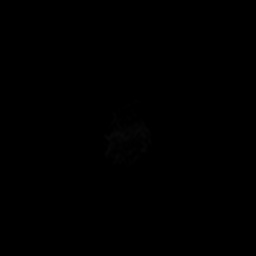

[Series 8: ax dwi_adc · axial · 3.0mm · 0.94mm/px · z∈[-111,+50]mm · 3 of 46 slices shown]
[im 1/46]
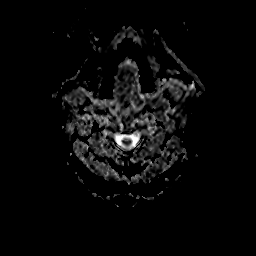
[im 23/46]
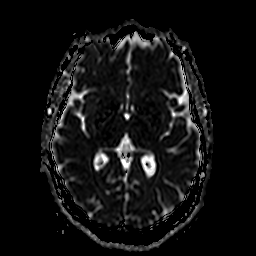
[im 46/46]
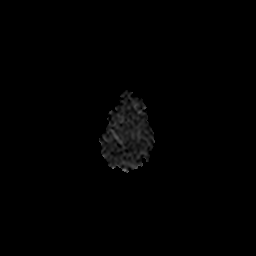

[Series 9: DWI · coronal · 5.0mm · 1.44mm/px · 4 of 60 slices shown (2 of 3)]
[im 1/60]
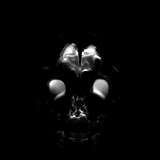
[im 20/60]
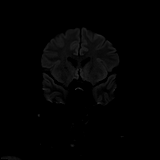
[im 40/60]
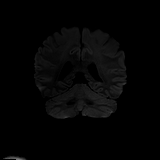
[im 60/60]
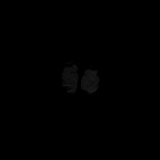

[Series 10: DWI · coronal · 5.0mm · 1.44mm/px · 2 of 30 slices shown (3 of 3)]
[im 1/30]
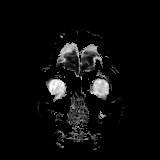
[im 30/30]
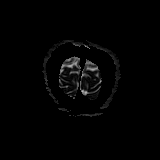

[Series 11: T2 · axial · 4.0mm · 0.36mm/px · z∈[-106,+49]mm · 2 of 31 slices shown (1 of 2)]
[im 1/31]
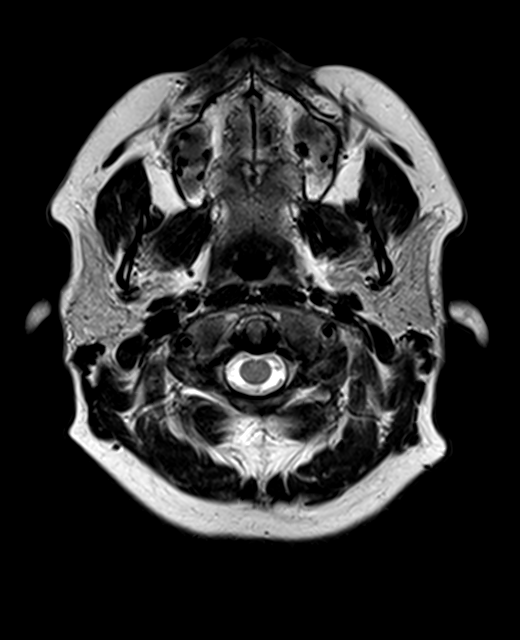
[im 31/31]
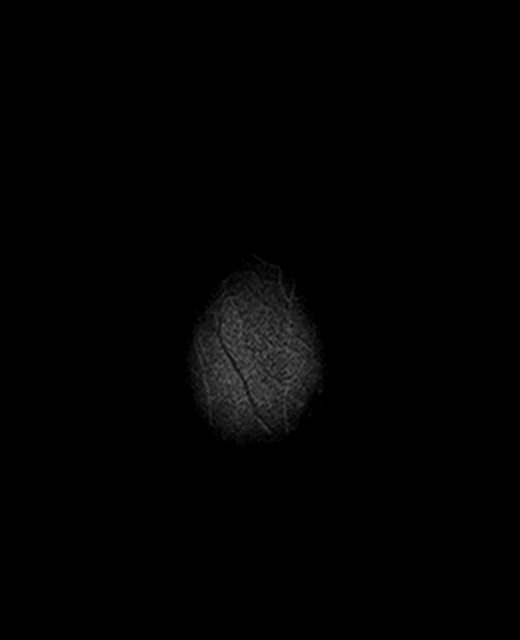

[Series 12: FLAIR · axial · 3.0mm · 0.72mm/px · z∈[-105,+44]mm · 2 of 26 slices shown]
[im 1/26]
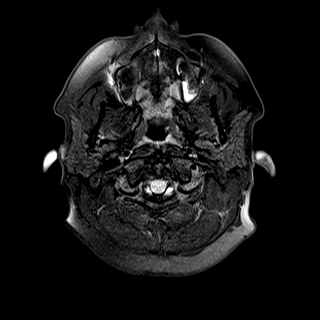
[im 26/26]
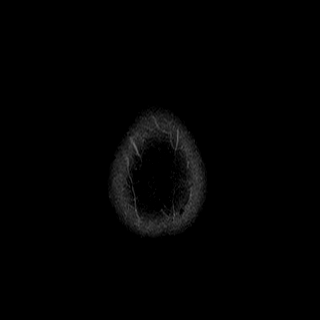

[Series 13: swi_images · axial · 1.5mm · 0.90mm/px · z∈[-105,+49]mm · 6 of 104 slices shown]
[im 1/104]
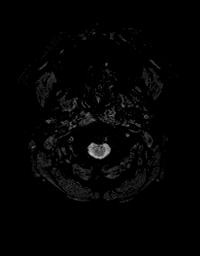
[im 21/104]
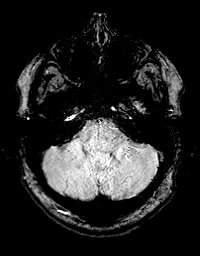
[im 42/104]
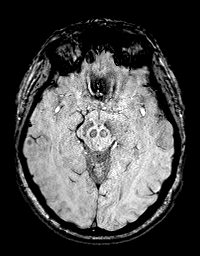
[im 62/104]
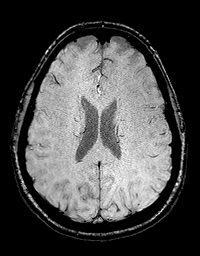
[im 83/104]
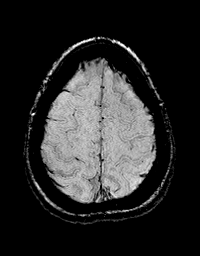
[im 104/104]
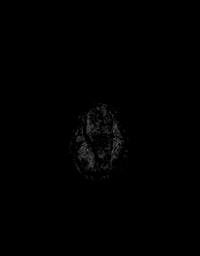

[Series 15: T1 · axial · 1.0mm · 0.94mm/px · z∈[-107,+51]mm · 10 of 160 slices shown (2 of 2)]
[im 1/160]
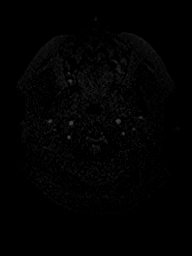
[im 18/160]
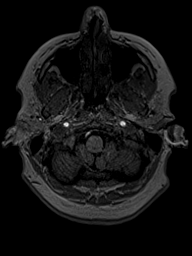
[im 36/160]
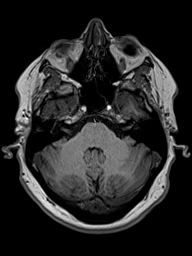
[im 54/160]
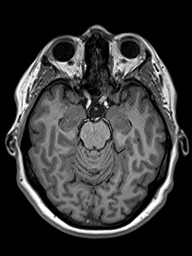
[im 71/160]
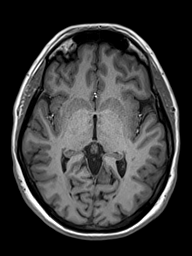
[im 89/160]
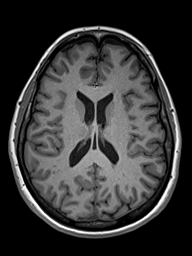
[im 107/160]
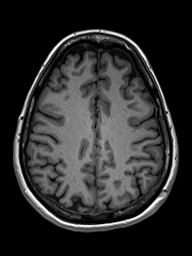
[im 124/160]
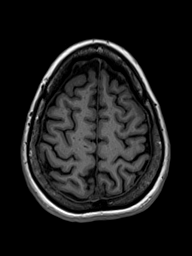
[im 142/160]
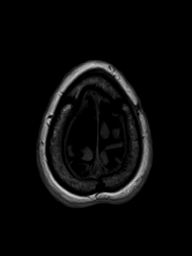
[im 160/160]
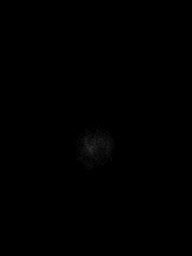

[Series 16: T2 · coronal · 4.5mm · 0.36mm/px · 2 of 30 slices shown (2 of 2)]
[im 1/30]
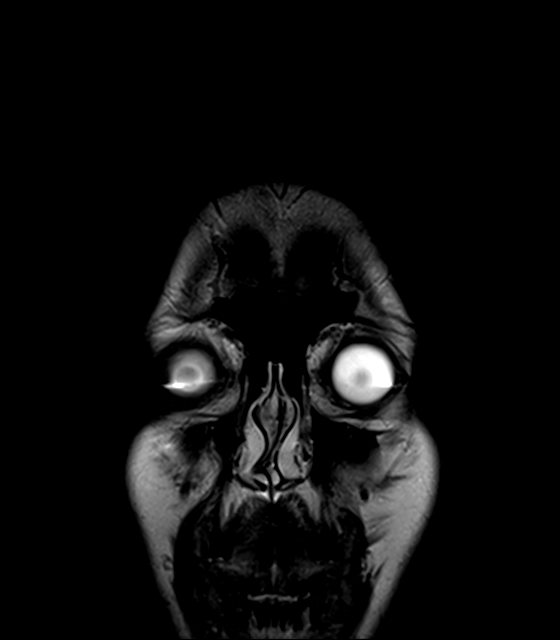
[im 30/30]
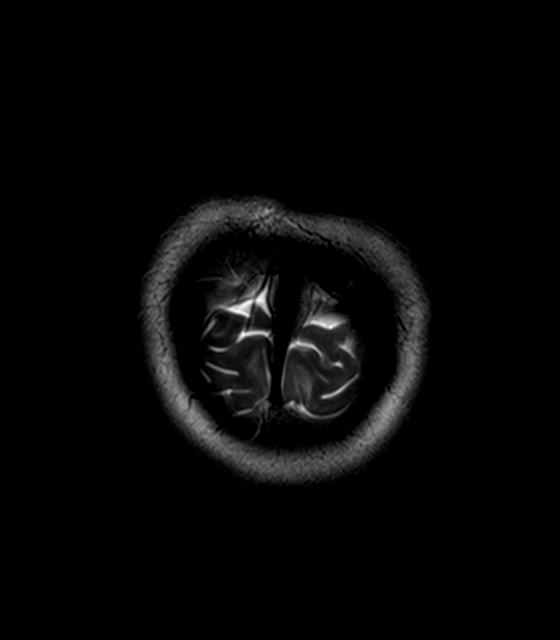

[48 of 48 positions shown; findings below may reference images not displayed]

FINDINGS: Brain: There is no evidence of an acute infarct, intracranial
hemorrhage, midline shift, or extra-axial fluid collection. A 7 mm
pineal cyst is unchanged and has a benign appearance on this
noncontrast study. The questioned tiny colloid cyst on the prior CT
is not clearly seen by MRI. No significant white matter disease is
evident. There is no cortical encephalomalacia. The ventricles and
sulci are normal. The cerebellar tonsils are normally positioned.
The pituitary gland is normal in size.

Vascular: Major intracranial vascular flow voids are preserved.

Skull and upper cervical spine: Unremarkable bone marrow signal.

Sinuses/Orbits: Unremarkable orbits. Paranasal sinuses and mastoid
air cells are clear.

Other: None.
IMPRESSION: Unchanged 7 mm pineal cyst, otherwise unremarkable appearance of the
brain. No acute intracranial abnormality.

## 2023-11-27 NOTE — Patient Instructions (Signed)
 It was great to see you again today, I will be in touch with your lab work

## 2023-11-27 NOTE — Progress Notes (Unsigned)
 [facility] [address], [address] [place], [place] [address] [phone]  Date:  11/30/2023   Name:  Janet Owens   DOB:  [birth year]   MRN:  [id number]  PCP:  Watt Harlene BROCKS, MD    Chief Complaint: No chief complaint on file.   History of Present Illness:  Janet Owens is a 48 y.o. very pleasant female patient who presents with the following:  Patient seen today for physical exam.  Most recent visit with myself was a virtual visit for UTI in May.  Otherwise we last saw her for physical about 1 year ago  She had her IUD removed in February of her GYN and it looks like she is trying to conceive  History of hypertension, dyslipidemia, obesity, status post gastric sleeve surgery   Tetanus can be boosted Colon cancer screening now due Mammogram up to date Pap 1 year ago showed ASCUS with negative HPV- recommendation is for recheck in 3 years however can certainly check sooner.  I do not have information about previous abnormal Paps that may have existed  Blood work done 1 year ago, she also had some labs done per her gynecologist and October  Amlodipine  2.5 Aspirin 81  She also has been seen by neurology, Dr. Tobie for concern of headaches and intracranial stenosis: IMPRESSION/PLAN: Right arm paresthesias contributing be carpal tunnel syndrome - Encouraged to wear a wrist brace at nighttime - Continue gabapentin  300mg  at bedtime 2. Intracranial stenosis with bilateral M2 stenosis, worse on the right. Asymptomatic. MRI brain did not show acute stroke.  She has known 7mm pineal cyst.  She has been on dual antiplatelet therapy, now on aspirin 81mg .  Repeat imaging was performed yesterday by Dr. Lanis, I do not have these images to review.               - LDL significantly imporved 168 >> 53 on statin therapy              - Continue Crestor  20mg  daily              - Continue aspirin 81mg  daily              - BP was  mildly elevated today, but improved on recheck.  Advised to follow BP at home and see PCP if it remains elevated 3. Headache associated with sexual activity, improved              - Previously offered propranolol , which she only used once              - Avoid triptans with known intracranial stenosis Return to clinic in 1 year     Patient Active Problem List   Diagnosis Date Noted   H/O gastric sleeve 03/05/2023   History of gestational diabetes 03/04/2023   Positive ANA (antinuclear antibody) 09/11/2021   Obstructive sleep apnea 06/21/2021   Urticaria 04/11/2021   Angio-edema 04/11/2021   Vitamin D  deficiency 01/12/2021   Onychomycosis due to dermatophyte 12/12/2019   Essential hypertension 09/05/2019   Hyperlipidemia 09/05/2019   Obesity (BMI 30.0-34.9) 09/05/2019   Spondylosis of cervical region without myelopathy or radiculopathy 09/05/2019    Past Medical History:  Diagnosis Date   Angio-edema    Arthritis    Chickenpox    Hemorrhoids    Hyperlipidemia    Hypertension    Urticaria     Past Surgical History:  Procedure Laterality Date  KNEE SURGERY  2002   LAPAROSCOPIC GASTRIC SLEEVE RESECTION      Social History   Tobacco Use   Smoking status: Former    Current packs/day: 0.00    Average packs/day: 0.1 packs/day for 7.0 years (0.7 ttl pk-yrs)    Types: Cigarettes    Start date: 2005    Quit date: 2012    Years since quitting: 13.5    Passive exposure: Never   Smokeless tobacco: Never  Vaping Use   Vaping status: Never Used  Substance Use Topics   Alcohol use: Yes    Comment: Once a month   Drug use: Never    Family History  Problem Relation Age of Onset   Urticaria Mother    Allergic rhinitis Mother    Arthritis Mother    Hyperlipidemia Mother    Hypertension Mother    Cancer Father    COPD Father    Depression Father    Drug abuse Father    Early death Father    Hearing loss Father    Stroke Father    Alcohol abuse Father    Deep vein  thrombosis Father    Alcohol abuse Brother    Asthma Brother    Drug abuse Brother    Early death Brother    Alcohol abuse Maternal Grandmother    Hearing loss Maternal Grandmother    Miscarriages / Stillbirths Maternal Grandmother    Heart attack Maternal Grandfather    Hyperlipidemia Maternal Grandfather    Hypertension Maternal Grandfather    Alcohol abuse Paternal Grandmother    Depression Paternal Grandmother    Drug abuse Paternal Grandmother    Heart attack Paternal Grandmother    Hyperlipidemia Paternal Grandmother    Hypertension Paternal Grandmother    Heart disease Paternal Grandmother    Birth defects Paternal Grandfather    Allergies Daughter    Eczema Son     Allergies  Allergen Reactions   Other Hives   Penicillins Hives   Sulfa Antibiotics Hives    Medication list has been reviewed and updated.  Current Outpatient Medications on File Prior to Visit  Medication Sig Dispense Refill   amLODipine  (NORVASC ) 2.5 MG tablet Take 1 tablet (2.5 mg total) by mouth daily. 90 tablet 3   aspirin EC 81 MG tablet Take 81 mg by mouth daily. Swallow whole.     cetirizine (ZYRTEC) 10 MG tablet Take 10 mg by mouth daily.     Cholecalciferol (VITAMIN D ) 50 MCG (2000 UT) CAPS Take 2,000 Units by mouth daily.     Cyanocobalamin  (VITAMIN B-12 PO) Take by mouth.     docusate sodium (COLACE) 100 MG capsule      famotidine  (PEPCID ) 20 MG tablet      Ferrous Sulfate (IRON PO) Take by mouth.     nitrofurantoin , macrocrystal-monohydrate, (MACROBID ) 100 MG capsule Take 1 capsule (100 mg total) by mouth 2 (two) times daily. 14 capsule 0   UNABLE TO FIND Med Name: Mushroom Trip     UNABLE TO FIND One a day Women's Metabolism     No current facility-administered medications on file prior to visit.    Review of Systems:  As per HPI- otherwise negative.   Physical Examination: There were no vitals filed for this visit. There were no vitals filed for this visit. There is no height  or weight on file to calculate BMI. Ideal Body Weight:    GEN: no acute distress. HEENT: Atraumatic, Normocephalic.  Ears and Nose:  No external deformity. CV: RRR, No M/G/R. No JVD. No thrill. No extra heart sounds. PULM: CTA B, no wheezes, crackles, rhonchi. No retractions. No resp. distress. No accessory muscle use. ABD: S, NT, ND, +BS. No rebound. No HSM. EXTR: No c/c/e PSYCH: Normally interactive. Conversant.    Assessment and Plan: *** Physical exam today.  Encouraged healthy diet and exercise routine Will plan further follow- up pending labs.  Signed Harlene Schroeder, MD

## 2023-11-30 ENCOUNTER — Encounter: Payer: Self-pay | Admitting: Family Medicine

## 2023-11-30 ENCOUNTER — Other Ambulatory Visit (HOSPITAL_COMMUNITY)
Admission: RE | Admit: 2023-11-30 | Discharge: 2023-11-30 | Disposition: A | Discharge status: Home / Self Care | Source: Ambulatory Visit | Attending: Family Medicine | Admitting: Family Medicine

## 2023-11-30 ENCOUNTER — Ambulatory Visit (INDEPENDENT_AMBULATORY_CARE_PROVIDER_SITE_OTHER): Admitting: Family Medicine

## 2023-11-30 VITALS — BP 136/84 | HR 63 | Ht 62.0 in | Wt 171.4 lb

## 2023-11-30 DIAGNOSIS — Z131 Encounter for screening for diabetes mellitus: Secondary | ICD-10-CM

## 2023-11-30 DIAGNOSIS — E782 Mixed hyperlipidemia: Secondary | ICD-10-CM

## 2023-11-30 DIAGNOSIS — Z124 Encounter for screening for malignant neoplasm of cervix: Secondary | ICD-10-CM | POA: Insufficient documentation

## 2023-11-30 DIAGNOSIS — Z1329 Encounter for screening for other suspected endocrine disorder: Secondary | ICD-10-CM

## 2023-11-30 DIAGNOSIS — Z903 Acquired absence of stomach [part of]: Secondary | ICD-10-CM | POA: Diagnosis not present

## 2023-11-30 DIAGNOSIS — Z130 Encounter for screening for diseases of the blood and blood-forming organs and certain disorders involving the immune mechanism: Secondary | ICD-10-CM

## 2023-11-30 DIAGNOSIS — Z23 Encounter for immunization: Secondary | ICD-10-CM | POA: Diagnosis not present

## 2023-11-30 DIAGNOSIS — Z0000 Encounter for general adult medical examination without abnormal findings: Secondary | ICD-10-CM

## 2023-11-30 DIAGNOSIS — Z1211 Encounter for screening for malignant neoplasm of colon: Secondary | ICD-10-CM

## 2023-11-30 LAB — CBC
HCT: 40.3 % (ref 36.0–46.0)
Hemoglobin: 13.3 g/dL (ref 12.0–15.0)
MCHC: 33.1 g/dL (ref 30.0–36.0)
MCV: 93.6 fl (ref 78.0–100.0)
Platelets: 278 K/uL (ref 150.0–400.0)
RBC: 4.31 Mil/uL (ref 3.87–5.11)
RDW: 12.9 % (ref 11.5–15.5)
WBC: 5 K/uL (ref 4.0–10.5)

## 2023-11-30 LAB — LIPID PANEL
Cholesterol: 201 mg/dL — ABNORMAL HIGH (ref 0–200)
HDL: 63.4 mg/dL (ref >39.00)
LDL Cholesterol: 123 mg/dL — ABNORMAL HIGH (ref 0–99)
NonHDL: 138.01
Total CHOL/HDL Ratio: 3
Triglycerides: 73 mg/dL (ref 0.0–149.0)
VLDL: 14.6 mg/dL (ref 0.0–40.0)

## 2023-11-30 LAB — COMPREHENSIVE METABOLIC PANEL WITH GFR
ALT: 12 U/L (ref 0–35)
AST: 16 U/L (ref 0–37)
Albumin: 4.4 g/dL (ref 3.5–5.2)
Alkaline Phosphatase: 41 U/L (ref 39–117)
BUN: 12 mg/dL (ref 6–23)
CO2: 28 meq/L (ref 19–32)
Calcium: 9.7 mg/dL (ref 8.4–10.5)
Chloride: 104 meq/L (ref 96–112)
Creatinine, Ser: 0.76 mg/dL (ref 0.40–1.20)
GFR: 94.47 mL/min (ref >60.00)
Glucose, Bld: 92 mg/dL (ref 70–99)
Potassium: 3.8 meq/L (ref 3.5–5.1)
Sodium: 140 meq/L (ref 135–145)
Total Bilirubin: 0.6 mg/dL (ref 0.2–1.2)
Total Protein: 7.1 g/dL (ref 6.0–8.3)

## 2023-11-30 LAB — FERRITIN: Ferritin: 47.7 ng/mL (ref 10.0–291.0)

## 2023-11-30 LAB — VITAMIN D 25 HYDROXY (VIT D DEFICIENCY, FRACTURES): VITD: 26.47 ng/mL — ABNORMAL LOW (ref 30.00–100.00)

## 2023-11-30 LAB — TSH: TSH: 1.71 u[IU]/mL (ref 0.35–5.50)

## 2023-11-30 LAB — HEMOGLOBIN A1C: Hgb A1c MFr Bld: 5.3 % (ref 4.6–6.5)

## 2023-11-30 LAB — VITAMIN B12: Vitamin B-12: 282 pg/mL (ref 211–911)

## 2023-12-07 ENCOUNTER — Encounter: Payer: Self-pay | Admitting: Family Medicine

## 2023-12-07 LAB — CYTOLOGY - PAP
Comment: NEGATIVE
Diagnosis: NEGATIVE
High risk HPV: NEGATIVE

## 2023-12-22 LAB — COLOGUARD: COLOGUARD: NEGATIVE

## 2023-12-23 ENCOUNTER — Encounter: Payer: Self-pay | Admitting: Family Medicine

## 2024-06-10 ENCOUNTER — Other Ambulatory Visit: Payer: Self-pay | Admitting: Family Medicine

## 2024-06-10 DIAGNOSIS — I10 Essential (primary) hypertension: Secondary | ICD-10-CM
